# Patient Record
Sex: Female | Born: 1945 | Race: White | Hispanic: No | Marital: Married | Smoking: Former smoker
Health system: Southern US, Community
[De-identification: ages and names within clinical notes are randomized; demographics above are authoritative.]

## PROBLEM LIST (undated history)

## (undated) DIAGNOSIS — Z860100 Personal history of colon polyps, unspecified: Secondary | ICD-10-CM

## (undated) DIAGNOSIS — T7840XA Allergy, unspecified, initial encounter: Secondary | ICD-10-CM

## (undated) DIAGNOSIS — M1990 Unspecified osteoarthritis, unspecified site: Secondary | ICD-10-CM

## (undated) DIAGNOSIS — I493 Ventricular premature depolarization: Secondary | ICD-10-CM

## (undated) DIAGNOSIS — I10 Essential (primary) hypertension: Secondary | ICD-10-CM

## (undated) DIAGNOSIS — H35059 Retinal neovascularization, unspecified, unspecified eye: Secondary | ICD-10-CM

## (undated) DIAGNOSIS — C4491 Basal cell carcinoma of skin, unspecified: Secondary | ICD-10-CM

## (undated) DIAGNOSIS — E785 Hyperlipidemia, unspecified: Secondary | ICD-10-CM

## (undated) DIAGNOSIS — H269 Unspecified cataract: Secondary | ICD-10-CM

## (undated) DIAGNOSIS — K589 Irritable bowel syndrome without diarrhea: Secondary | ICD-10-CM

## (undated) DIAGNOSIS — G43909 Migraine, unspecified, not intractable, without status migrainosus: Secondary | ICD-10-CM

## (undated) DIAGNOSIS — I491 Atrial premature depolarization: Secondary | ICD-10-CM

## (undated) DIAGNOSIS — I351 Nonrheumatic aortic (valve) insufficiency: Secondary | ICD-10-CM

## (undated) DIAGNOSIS — Z8619 Personal history of other infectious and parasitic diseases: Secondary | ICD-10-CM

## (undated) DIAGNOSIS — M797 Fibromyalgia: Secondary | ICD-10-CM

## (undated) DIAGNOSIS — H35051 Retinal neovascularization, unspecified, right eye: Secondary | ICD-10-CM

## (undated) DIAGNOSIS — R931 Abnormal findings on diagnostic imaging of heart and coronary circulation: Secondary | ICD-10-CM

## (undated) DIAGNOSIS — I341 Nonrheumatic mitral (valve) prolapse: Secondary | ICD-10-CM

## (undated) HISTORY — DX: Migraine, unspecified, not intractable, without status migrainosus: G43.909

## (undated) HISTORY — DX: Irritable bowel syndrome, unspecified: K58.9

## (undated) HISTORY — DX: Allergy, unspecified, initial encounter: T78.40XA

## (undated) HISTORY — DX: Retinal neovascularization, unspecified, unspecified eye: H35.059

## (undated) HISTORY — PX: COLONOSCOPY: SHX174

## (undated) HISTORY — DX: Ventricular premature depolarization: I49.3

## (undated) HISTORY — DX: Basal cell carcinoma of skin, unspecified: C44.91

## (undated) HISTORY — DX: Unspecified cataract: H26.9

## (undated) HISTORY — DX: Abnormal findings on diagnostic imaging of heart and coronary circulation: R93.1

## (undated) HISTORY — DX: Personal history of colon polyps, unspecified: Z86.0100

## (undated) HISTORY — DX: Nonrheumatic mitral (valve) prolapse: I34.1

## (undated) HISTORY — DX: Personal history of colonic polyps: Z86.010

## (undated) HISTORY — DX: Unspecified osteoarthritis, unspecified site: M19.90

## (undated) HISTORY — DX: Fibromyalgia: M79.7

## (undated) HISTORY — DX: Nonrheumatic aortic (valve) insufficiency: I35.1

## (undated) HISTORY — DX: Essential (primary) hypertension: I10

## (undated) HISTORY — DX: Hyperlipidemia, unspecified: E78.5

## (undated) HISTORY — DX: Retinal neovascularization, unspecified, right eye: H35.051

## (undated) HISTORY — PX: WISDOM TOOTH EXTRACTION: SHX21

## (undated) HISTORY — DX: Atrial premature depolarization: I49.1

## (undated) HISTORY — DX: Personal history of other infectious and parasitic diseases: Z86.19

---

## 1976-04-28 HISTORY — PX: TUBAL LIGATION: SHX77

## 1998-03-26 ENCOUNTER — Other Ambulatory Visit: Admission: RE | Admit: 1998-03-26 | Discharge: 1998-03-26 | Payer: Self-pay | Admitting: Obstetrics and Gynecology

## 1999-04-12 ENCOUNTER — Other Ambulatory Visit: Admission: RE | Admit: 1999-04-12 | Discharge: 1999-04-12 | Payer: Self-pay | Admitting: Obstetrics and Gynecology

## 1999-08-03 ENCOUNTER — Encounter: Payer: Self-pay | Admitting: Emergency Medicine

## 1999-08-03 ENCOUNTER — Observation Stay (HOSPITAL_COMMUNITY): Admission: EM | Admit: 1999-08-03 | Discharge: 1999-08-04 | Payer: Self-pay | Admitting: Emergency Medicine

## 2000-04-13 ENCOUNTER — Other Ambulatory Visit: Admission: RE | Admit: 2000-04-13 | Discharge: 2000-04-13 | Payer: Self-pay | Admitting: Obstetrics and Gynecology

## 2000-10-16 ENCOUNTER — Encounter (INDEPENDENT_AMBULATORY_CARE_PROVIDER_SITE_OTHER): Payer: Self-pay

## 2000-10-16 ENCOUNTER — Other Ambulatory Visit: Admission: RE | Admit: 2000-10-16 | Discharge: 2000-10-16 | Payer: Self-pay | Admitting: Obstetrics and Gynecology

## 2000-12-17 ENCOUNTER — Encounter: Payer: Self-pay | Admitting: Internal Medicine

## 2000-12-17 ENCOUNTER — Encounter (INDEPENDENT_AMBULATORY_CARE_PROVIDER_SITE_OTHER): Payer: Self-pay | Admitting: Specialist

## 2000-12-17 ENCOUNTER — Other Ambulatory Visit: Admission: RE | Admit: 2000-12-17 | Discharge: 2000-12-17 | Payer: Self-pay | Admitting: Internal Medicine

## 2001-01-13 ENCOUNTER — Inpatient Hospital Stay (HOSPITAL_COMMUNITY): Admission: EM | Admit: 2001-01-13 | Discharge: 2001-01-14 | Payer: Self-pay | Admitting: *Deleted

## 2001-04-14 ENCOUNTER — Other Ambulatory Visit: Admission: RE | Admit: 2001-04-14 | Discharge: 2001-04-14 | Payer: Self-pay | Admitting: Obstetrics and Gynecology

## 2002-12-08 ENCOUNTER — Other Ambulatory Visit: Admission: RE | Admit: 2002-12-08 | Discharge: 2002-12-08 | Payer: Self-pay | Admitting: Obstetrics and Gynecology

## 2003-12-26 ENCOUNTER — Other Ambulatory Visit: Admission: RE | Admit: 2003-12-26 | Discharge: 2003-12-26 | Payer: Self-pay | Admitting: Obstetrics and Gynecology

## 2005-01-07 ENCOUNTER — Other Ambulatory Visit: Admission: RE | Admit: 2005-01-07 | Discharge: 2005-01-07 | Payer: Self-pay | Admitting: Obstetrics and Gynecology

## 2005-11-14 ENCOUNTER — Ambulatory Visit: Payer: Self-pay | Admitting: Internal Medicine

## 2005-12-02 ENCOUNTER — Ambulatory Visit: Payer: Self-pay | Admitting: Internal Medicine

## 2005-12-02 ENCOUNTER — Encounter (INDEPENDENT_AMBULATORY_CARE_PROVIDER_SITE_OTHER): Payer: Self-pay | Admitting: Specialist

## 2006-01-13 ENCOUNTER — Other Ambulatory Visit: Admission: RE | Admit: 2006-01-13 | Discharge: 2006-01-13 | Payer: Self-pay | Admitting: Obstetrics and Gynecology

## 2007-01-15 ENCOUNTER — Other Ambulatory Visit: Admission: RE | Admit: 2007-01-15 | Discharge: 2007-01-15 | Payer: Self-pay | Admitting: Obstetrics and Gynecology

## 2008-04-28 HISTORY — PX: BELPHAROPTOSIS REPAIR: SHX369

## 2008-07-03 ENCOUNTER — Encounter: Payer: Self-pay | Admitting: Internal Medicine

## 2008-08-10 ENCOUNTER — Encounter: Payer: Self-pay | Admitting: Internal Medicine

## 2008-08-17 ENCOUNTER — Telehealth: Payer: Self-pay | Admitting: Internal Medicine

## 2008-08-18 DIAGNOSIS — K5730 Diverticulosis of large intestine without perforation or abscess without bleeding: Secondary | ICD-10-CM | POA: Insufficient documentation

## 2008-08-18 DIAGNOSIS — Z860100 Personal history of colon polyps, unspecified: Secondary | ICD-10-CM | POA: Insufficient documentation

## 2008-08-18 DIAGNOSIS — K219 Gastro-esophageal reflux disease without esophagitis: Secondary | ICD-10-CM | POA: Insufficient documentation

## 2008-08-18 DIAGNOSIS — R1013 Epigastric pain: Secondary | ICD-10-CM

## 2008-08-28 ENCOUNTER — Ambulatory Visit: Payer: Self-pay | Admitting: Internal Medicine

## 2008-08-28 DIAGNOSIS — E785 Hyperlipidemia, unspecified: Secondary | ICD-10-CM | POA: Insufficient documentation

## 2008-08-28 DIAGNOSIS — IMO0001 Reserved for inherently not codable concepts without codable children: Secondary | ICD-10-CM | POA: Insufficient documentation

## 2008-08-28 DIAGNOSIS — I059 Rheumatic mitral valve disease, unspecified: Secondary | ICD-10-CM | POA: Insufficient documentation

## 2008-08-28 DIAGNOSIS — I4949 Other premature depolarization: Secondary | ICD-10-CM

## 2008-08-28 DIAGNOSIS — I491 Atrial premature depolarization: Secondary | ICD-10-CM

## 2008-08-28 DIAGNOSIS — M129 Arthropathy, unspecified: Secondary | ICD-10-CM | POA: Insufficient documentation

## 2008-08-28 DIAGNOSIS — I10 Essential (primary) hypertension: Secondary | ICD-10-CM

## 2008-10-10 ENCOUNTER — Telehealth: Payer: Self-pay | Admitting: Internal Medicine

## 2008-10-16 ENCOUNTER — Encounter: Payer: Self-pay | Admitting: Internal Medicine

## 2008-10-16 ENCOUNTER — Ambulatory Visit (HOSPITAL_COMMUNITY): Admission: RE | Admit: 2008-10-16 | Discharge: 2008-10-16 | Payer: Self-pay | Admitting: Internal Medicine

## 2008-10-17 ENCOUNTER — Ambulatory Visit: Payer: Self-pay | Admitting: Internal Medicine

## 2008-10-17 ENCOUNTER — Encounter: Payer: Self-pay | Admitting: Internal Medicine

## 2008-10-18 ENCOUNTER — Encounter: Payer: Self-pay | Admitting: Internal Medicine

## 2008-11-02 ENCOUNTER — Encounter: Payer: Self-pay | Admitting: Internal Medicine

## 2008-11-03 ENCOUNTER — Ambulatory Visit: Payer: Self-pay | Admitting: Internal Medicine

## 2009-04-28 HISTORY — PX: BREAST BIOPSY: SHX20

## 2010-09-13 NOTE — Consult Note (Signed)
NAME:  Michelle Davidson, WILEY NO.:  [id number]   MEDICAL RECORD NO.:  [id number]           PATIENT TYPE:   LOCATION:                                 FACILITY:   PHYSICIAN:  Hedwig Morton. Juanda Chance, MD     DATE OF BIRTH:  [birth year]   DATE OF CONSULTATION:  DATE OF DISCHARGE:                                 CONSULTATION   NAME OF PROCEDURE:  Ambulatory pH procedure.   INDICATIONS:  This 65 year old white female  who has complained  of  scratchiness in her throat, tightness. She was diagnosed with Barrett  esophagus based on upper endoscopy done in her hometown by her  gastroenterologist.  The histological diagnosis , however, did not  confirm  Barrett esophagus.  She has not responded to proton pump  inhibitors.  In fact, she had side effects from them.  She is undergoing  24 pH probe  off medications to determine the severity her  acid reflux.   RESULTS:  Two-channel probe has passed through nares into the distal  esophagus.  The distal catheter was placed 5 cm above the lower  esophageal sphincter and  the proximal probe  at 10 cm above the lower  esophageal sphincter.  In proximal channel, the pH in the esophagus was  less than 4.2% of the time, total 0.1% of the time, all in upright  position.  There were no episodes of reflux lasting over 5 minutes.  There were a total of only 9 reflux episodes.  In the distal channel,  the acidity in the esophagus was less than 4.6% of the time which is  abnormally high, normal being less than 4.2%.  Most of the pH < 4  occurred in the recumbent position  at 4.5% of the time.  The patient  had a total of 118 reflux episodes, 63 of them in upright position and  55 in the recumbent position.  Longest episode of the reflux lasted 19  minutes in upright position and 17 minutes in recumbent position.   The composite score analysis showed  score of 32.5 which is abnormally  high , normal being less than 22.   IMPRESSION:  This is an abnormal  intraesophageal pH probe study  comnsistent  a significant gastroesophageal reflux off medications.      Hedwig Morton. Juanda Chance, MD  Electronically Signed     DMB/MEDQ  D:  11/02/2008  T:  11/03/2008  Job:  [id number]

## 2010-12-25 ENCOUNTER — Encounter: Payer: Self-pay | Admitting: Internal Medicine

## 2012-01-15 ENCOUNTER — Encounter: Payer: Self-pay | Admitting: Internal Medicine

## 2014-02-03 ENCOUNTER — Encounter: Payer: Self-pay | Admitting: Internal Medicine

## 2016-01-21 LAB — CBC AND DIFFERENTIAL
HEMATOCRIT: 39 (ref 36–46)
HEMOGLOBIN: 13.4 (ref 12.0–16.0)
PLATELETS: 271 (ref 150–399)
WBC: 5.6

## 2016-05-06 DIAGNOSIS — I059 Rheumatic mitral valve disease, unspecified: Secondary | ICD-10-CM | POA: Diagnosis not present

## 2016-05-06 DIAGNOSIS — R9431 Abnormal electrocardiogram [ECG] [EKG]: Secondary | ICD-10-CM | POA: Diagnosis not present

## 2016-05-06 DIAGNOSIS — I491 Atrial premature depolarization: Secondary | ICD-10-CM | POA: Diagnosis not present

## 2016-05-06 DIAGNOSIS — I10 Essential (primary) hypertension: Secondary | ICD-10-CM | POA: Diagnosis not present

## 2016-05-06 DIAGNOSIS — R002 Palpitations: Secondary | ICD-10-CM | POA: Diagnosis not present

## 2016-05-06 DIAGNOSIS — I493 Ventricular premature depolarization: Secondary | ICD-10-CM | POA: Diagnosis not present

## 2016-05-29 DIAGNOSIS — R208 Other disturbances of skin sensation: Secondary | ICD-10-CM | POA: Diagnosis not present

## 2016-05-29 DIAGNOSIS — Z09 Encounter for follow-up examination after completed treatment for conditions other than malignant neoplasm: Secondary | ICD-10-CM | POA: Diagnosis not present

## 2016-05-29 DIAGNOSIS — L820 Inflamed seborrheic keratosis: Secondary | ICD-10-CM | POA: Diagnosis not present

## 2016-05-29 DIAGNOSIS — Z872 Personal history of diseases of the skin and subcutaneous tissue: Secondary | ICD-10-CM | POA: Diagnosis not present

## 2016-05-29 DIAGNOSIS — L538 Other specified erythematous conditions: Secondary | ICD-10-CM | POA: Diagnosis not present

## 2016-06-10 DIAGNOSIS — H353211 Exudative age-related macular degeneration, right eye, with active choroidal neovascularization: Secondary | ICD-10-CM | POA: Diagnosis not present

## 2016-06-12 DIAGNOSIS — E782 Mixed hyperlipidemia: Secondary | ICD-10-CM | POA: Diagnosis not present

## 2016-06-12 DIAGNOSIS — M542 Cervicalgia: Secondary | ICD-10-CM | POA: Diagnosis not present

## 2016-06-12 DIAGNOSIS — I10 Essential (primary) hypertension: Secondary | ICD-10-CM | POA: Diagnosis not present

## 2016-06-12 DIAGNOSIS — Z6828 Body mass index (BMI) 28.0-28.9, adult: Secondary | ICD-10-CM | POA: Diagnosis not present

## 2016-06-12 DIAGNOSIS — Z8669 Personal history of other diseases of the nervous system and sense organs: Secondary | ICD-10-CM | POA: Diagnosis not present

## 2016-06-13 DIAGNOSIS — H353211 Exudative age-related macular degeneration, right eye, with active choroidal neovascularization: Secondary | ICD-10-CM | POA: Diagnosis not present

## 2016-07-01 DIAGNOSIS — K5730 Diverticulosis of large intestine without perforation or abscess without bleeding: Secondary | ICD-10-CM | POA: Diagnosis not present

## 2016-07-01 DIAGNOSIS — D759 Disease of blood and blood-forming organs, unspecified: Secondary | ICD-10-CM | POA: Diagnosis not present

## 2016-07-01 DIAGNOSIS — Z800 Family history of malignant neoplasm of digestive organs: Secondary | ICD-10-CM | POA: Diagnosis not present

## 2016-07-01 DIAGNOSIS — Z86010 Personal history of colonic polyps: Secondary | ICD-10-CM | POA: Diagnosis not present

## 2016-07-01 DIAGNOSIS — K635 Polyp of colon: Secondary | ICD-10-CM | POA: Diagnosis not present

## 2016-07-01 DIAGNOSIS — Z7982 Long term (current) use of aspirin: Secondary | ICD-10-CM | POA: Diagnosis not present

## 2016-07-01 DIAGNOSIS — A150 Tuberculosis of lung: Secondary | ICD-10-CM | POA: Diagnosis not present

## 2016-07-01 DIAGNOSIS — K529 Noninfective gastroenteritis and colitis, unspecified: Secondary | ICD-10-CM | POA: Diagnosis not present

## 2016-07-04 DIAGNOSIS — D759 Disease of blood and blood-forming organs, unspecified: Secondary | ICD-10-CM | POA: Diagnosis not present

## 2016-07-04 DIAGNOSIS — Z1211 Encounter for screening for malignant neoplasm of colon: Secondary | ICD-10-CM | POA: Diagnosis not present

## 2016-07-04 DIAGNOSIS — Z86010 Personal history of colonic polyps: Secondary | ICD-10-CM | POA: Diagnosis not present

## 2016-07-04 DIAGNOSIS — D122 Benign neoplasm of ascending colon: Secondary | ICD-10-CM | POA: Diagnosis not present

## 2016-07-04 DIAGNOSIS — K648 Other hemorrhoids: Secondary | ICD-10-CM | POA: Diagnosis not present

## 2016-07-04 DIAGNOSIS — K5730 Diverticulosis of large intestine without perforation or abscess without bleeding: Secondary | ICD-10-CM | POA: Diagnosis not present

## 2016-07-04 DIAGNOSIS — K529 Noninfective gastroenteritis and colitis, unspecified: Secondary | ICD-10-CM | POA: Diagnosis not present

## 2016-07-04 DIAGNOSIS — K635 Polyp of colon: Secondary | ICD-10-CM | POA: Diagnosis not present

## 2016-07-04 DIAGNOSIS — Z800 Family history of malignant neoplasm of digestive organs: Secondary | ICD-10-CM | POA: Diagnosis not present

## 2016-07-04 DIAGNOSIS — D125 Benign neoplasm of sigmoid colon: Secondary | ICD-10-CM | POA: Diagnosis not present

## 2016-07-04 DIAGNOSIS — Z7982 Long term (current) use of aspirin: Secondary | ICD-10-CM | POA: Diagnosis not present

## 2016-07-04 DIAGNOSIS — A150 Tuberculosis of lung: Secondary | ICD-10-CM | POA: Diagnosis not present

## 2016-07-04 DIAGNOSIS — D124 Benign neoplasm of descending colon: Secondary | ICD-10-CM | POA: Diagnosis not present

## 2016-07-18 DIAGNOSIS — D126 Benign neoplasm of colon, unspecified: Secondary | ICD-10-CM | POA: Diagnosis not present

## 2016-07-18 DIAGNOSIS — R197 Diarrhea, unspecified: Secondary | ICD-10-CM | POA: Diagnosis not present

## 2016-08-15 DIAGNOSIS — H5203 Hypermetropia, bilateral: Secondary | ICD-10-CM | POA: Diagnosis not present

## 2016-08-15 DIAGNOSIS — H25813 Combined forms of age-related cataract, bilateral: Secondary | ICD-10-CM | POA: Diagnosis not present

## 2016-09-12 DIAGNOSIS — H353211 Exudative age-related macular degeneration, right eye, with active choroidal neovascularization: Secondary | ICD-10-CM | POA: Diagnosis not present

## 2016-09-30 DIAGNOSIS — K635 Polyp of colon: Secondary | ICD-10-CM | POA: Diagnosis not present

## 2016-09-30 DIAGNOSIS — M542 Cervicalgia: Secondary | ICD-10-CM | POA: Diagnosis not present

## 2016-09-30 DIAGNOSIS — Z6828 Body mass index (BMI) 28.0-28.9, adult: Secondary | ICD-10-CM | POA: Diagnosis not present

## 2016-09-30 DIAGNOSIS — I10 Essential (primary) hypertension: Secondary | ICD-10-CM | POA: Diagnosis not present

## 2016-09-30 DIAGNOSIS — E782 Mixed hyperlipidemia: Secondary | ICD-10-CM | POA: Diagnosis not present

## 2016-10-13 DIAGNOSIS — M542 Cervicalgia: Secondary | ICD-10-CM | POA: Diagnosis not present

## 2016-10-13 DIAGNOSIS — R29898 Other symptoms and signs involving the musculoskeletal system: Secondary | ICD-10-CM | POA: Diagnosis not present

## 2016-10-14 DIAGNOSIS — K529 Noninfective gastroenteritis and colitis, unspecified: Secondary | ICD-10-CM | POA: Diagnosis not present

## 2016-10-14 DIAGNOSIS — Z1231 Encounter for screening mammogram for malignant neoplasm of breast: Secondary | ICD-10-CM | POA: Diagnosis not present

## 2016-10-14 DIAGNOSIS — Z01419 Encounter for gynecological examination (general) (routine) without abnormal findings: Secondary | ICD-10-CM | POA: Diagnosis not present

## 2016-10-15 DIAGNOSIS — M542 Cervicalgia: Secondary | ICD-10-CM | POA: Diagnosis not present

## 2016-10-15 DIAGNOSIS — R29898 Other symptoms and signs involving the musculoskeletal system: Secondary | ICD-10-CM | POA: Diagnosis not present

## 2016-10-20 DIAGNOSIS — M542 Cervicalgia: Secondary | ICD-10-CM | POA: Diagnosis not present

## 2016-10-20 DIAGNOSIS — R29898 Other symptoms and signs involving the musculoskeletal system: Secondary | ICD-10-CM | POA: Diagnosis not present

## 2016-10-23 DIAGNOSIS — R29898 Other symptoms and signs involving the musculoskeletal system: Secondary | ICD-10-CM | POA: Diagnosis not present

## 2016-10-23 DIAGNOSIS — M542 Cervicalgia: Secondary | ICD-10-CM | POA: Diagnosis not present

## 2016-10-28 DIAGNOSIS — M542 Cervicalgia: Secondary | ICD-10-CM | POA: Diagnosis not present

## 2016-10-28 DIAGNOSIS — R29898 Other symptoms and signs involving the musculoskeletal system: Secondary | ICD-10-CM | POA: Diagnosis not present

## 2016-10-30 DIAGNOSIS — R29898 Other symptoms and signs involving the musculoskeletal system: Secondary | ICD-10-CM | POA: Diagnosis not present

## 2016-10-30 DIAGNOSIS — M542 Cervicalgia: Secondary | ICD-10-CM | POA: Diagnosis not present

## 2016-11-04 DIAGNOSIS — R29898 Other symptoms and signs involving the musculoskeletal system: Secondary | ICD-10-CM | POA: Diagnosis not present

## 2016-11-04 DIAGNOSIS — R197 Diarrhea, unspecified: Secondary | ICD-10-CM | POA: Diagnosis not present

## 2016-11-04 DIAGNOSIS — Z6828 Body mass index (BMI) 28.0-28.9, adult: Secondary | ICD-10-CM | POA: Diagnosis not present

## 2016-11-04 DIAGNOSIS — M542 Cervicalgia: Secondary | ICD-10-CM | POA: Diagnosis not present

## 2016-11-05 DIAGNOSIS — H353211 Exudative age-related macular degeneration, right eye, with active choroidal neovascularization: Secondary | ICD-10-CM | POA: Diagnosis not present

## 2016-11-05 DIAGNOSIS — H2513 Age-related nuclear cataract, bilateral: Secondary | ICD-10-CM | POA: Diagnosis not present

## 2016-11-06 DIAGNOSIS — M542 Cervicalgia: Secondary | ICD-10-CM | POA: Diagnosis not present

## 2016-11-06 DIAGNOSIS — R29898 Other symptoms and signs involving the musculoskeletal system: Secondary | ICD-10-CM | POA: Diagnosis not present

## 2016-11-13 DIAGNOSIS — Z9289 Personal history of other medical treatment: Secondary | ICD-10-CM | POA: Diagnosis not present

## 2016-11-13 DIAGNOSIS — Z1231 Encounter for screening mammogram for malignant neoplasm of breast: Secondary | ICD-10-CM | POA: Diagnosis not present

## 2016-11-13 DIAGNOSIS — Z803 Family history of malignant neoplasm of breast: Secondary | ICD-10-CM | POA: Diagnosis not present

## 2016-12-15 DIAGNOSIS — Z23 Encounter for immunization: Secondary | ICD-10-CM | POA: Diagnosis not present

## 2016-12-15 DIAGNOSIS — Z0000 Encounter for general adult medical examination without abnormal findings: Secondary | ICD-10-CM | POA: Diagnosis not present

## 2016-12-15 DIAGNOSIS — N959 Unspecified menopausal and perimenopausal disorder: Secondary | ICD-10-CM | POA: Diagnosis not present

## 2016-12-15 DIAGNOSIS — Z8669 Personal history of other diseases of the nervous system and sense organs: Secondary | ICD-10-CM | POA: Diagnosis not present

## 2016-12-15 DIAGNOSIS — Z6828 Body mass index (BMI) 28.0-28.9, adult: Secondary | ICD-10-CM | POA: Diagnosis not present

## 2016-12-15 DIAGNOSIS — Z1389 Encounter for screening for other disorder: Secondary | ICD-10-CM | POA: Diagnosis not present

## 2016-12-18 DIAGNOSIS — H353211 Exudative age-related macular degeneration, right eye, with active choroidal neovascularization: Secondary | ICD-10-CM | POA: Diagnosis not present

## 2017-02-09 DIAGNOSIS — Z23 Encounter for immunization: Secondary | ICD-10-CM | POA: Diagnosis not present

## 2017-03-24 DIAGNOSIS — H2513 Age-related nuclear cataract, bilateral: Secondary | ICD-10-CM | POA: Diagnosis not present

## 2017-03-24 DIAGNOSIS — H43822 Vitreomacular adhesion, left eye: Secondary | ICD-10-CM | POA: Diagnosis not present

## 2017-03-24 DIAGNOSIS — H43811 Vitreous degeneration, right eye: Secondary | ICD-10-CM | POA: Diagnosis not present

## 2017-05-19 ENCOUNTER — Ambulatory Visit (INDEPENDENT_AMBULATORY_CARE_PROVIDER_SITE_OTHER): Payer: Medicare Other | Admitting: Family Medicine

## 2017-05-19 ENCOUNTER — Encounter: Payer: Self-pay | Admitting: Family Medicine

## 2017-05-19 VITALS — BP 126/62 | HR 62 | Temp 98.4°F | Ht 62.75 in | Wt 158.0 lb

## 2017-05-19 DIAGNOSIS — I10 Essential (primary) hypertension: Secondary | ICD-10-CM | POA: Diagnosis not present

## 2017-05-19 DIAGNOSIS — R05 Cough: Secondary | ICD-10-CM | POA: Diagnosis not present

## 2017-05-19 DIAGNOSIS — Z860100 Personal history of colon polyps, unspecified: Secondary | ICD-10-CM

## 2017-05-19 DIAGNOSIS — F4321 Adjustment disorder with depressed mood: Secondary | ICD-10-CM | POA: Diagnosis not present

## 2017-05-19 DIAGNOSIS — R053 Chronic cough: Secondary | ICD-10-CM

## 2017-05-19 MED ORDER — HYDROCHLOROTHIAZIDE 12.5 MG PO CAPS: 12.5000 mg | ORAL_CAPSULE | Freq: Every day | ORAL | 1 refills | Status: DC

## 2017-05-19 MED ORDER — ATENOLOL 25 MG PO TABS: 25.0000 mg | ORAL_TABLET | Freq: Two times a day (BID) | ORAL | 1 refills | Status: DC

## 2017-05-19 MED ORDER — HYDROCODONE-HOMATROPINE 5-1.5 MG/5ML PO SYRP: 5.0000 mL | ORAL_SOLUTION | Freq: Three times a day (TID) | ORAL | 0 refills | Status: DC | PRN

## 2017-05-19 NOTE — Assessment & Plan Note (Signed)
Exam and history reassuring. Likely viral. Continue supportive care. Rx for hycodan printed and given to pt for severe cough- discussed sedation precautions.

## 2017-05-19 NOTE — Patient Instructions (Addendum)
Great to meet you.  We are referring you to gastroenterology.

## 2017-05-19 NOTE — Progress Notes (Signed)
Subjective:   Patient ID: Michelle Davidson, female    DOB: 1946-04-17, 72 y.o.   MRN: [id number]  Michelle Davidson is a pleasant 72 y.o. year old femaleyear old female who presents to clinic today with New Patient (Initial Visit) (Patient is here today to establish care); Grief Response (Patient disclosed while completing the PSQ that she lost her 73 1-week-ago at 72 years old from brain hemmorhage.); and Cough (Patient is here today C/O a cough that started on the 17th.  It is nonproductive.  Unable to sleep due to the cough and has been taking Robitussin DM but Sx still persist.  She thought she got rid of it from when it originally started around Thanksgiving but it has now come back and is worse.  Mild post-nasal drip.  Denies any facial pressure but is positive for nasal congestion. States "I've gone through boxes of nasal tissues.")  on 05/19/2017  HPI:  Grief- unfortunately lost her 9 year grandson 1 week ago to a brain hemorrhage. PHQ 9 is 14 today.   Cough- started 2 months ago.  Felt it had improved until 5 days ago when it became acutely worse.  Non productive.  Keeping her up at night.  Robitussin DM has helped but only a little.  HTN- has been well controlled with atenolol 25 mg twice daily and HCTZ 12.5 mg daily. Denies HA, blurred vision, CP or LE edema.  Current Outpatient Medications on File Prior to Visit  Medication Sig Dispense Refill  . fluticasone (FLONASE) 50 MCG/ACT nasal spray Place 1 spray into both nostrils daily.    . Multiple Vitamins-Minerals (CENTRUM SILVER 50+WOMEN PO) Take 1 tablet by mouth daily.    Marland Kitchen zolmitriptan (ZOMIG-ZMT) 5 MG disintegrating tablet zolmitriptan 5 mg disintegrating tablet  DISSOLVE 1 TABLET ON TONGUE AND SWALLOW AT ONSET OF MIGRAINE MAY REPEAT ONCE AFTER 2HRS MAX 10MG /DAY     No current facility-administered medications on file prior to visit.     Allergies  Allergen Reactions  . Esomeprazole Magnesium     Syncope  . Lipitor [Atorvastatin  Calcium]     Muscle aches  . Zocor [Simvastatin]     Muscle aches  . Avelox [Moxifloxacin Hcl In Nacl] Rash    Past Medical History:  Diagnosis Date  . Arthritis   . Cancer (HCC)    Skin  . Choroidal neovascularization    Right eye  . History of chicken pox   . History of colon polyps   . Hyperlipidemia   . Hypertension   . Migraines   . PAC (premature atrial contraction)   . PVC (premature ventricular contraction)       Family History  Problem Relation Age of Onset  . Arthritis Mother   . Hyperlipidemia Mother   . Hypertension Mother   . Cancer Father        Colon  . Arthritis Maternal Grandmother   . Diabetes Maternal Grandmother   . Cancer Maternal Grandfather        Lung-Smoker  . Heart attack Maternal Grandfather   . Cancer Paternal Grandmother        Uterine  . Diabetes Paternal Grandmother   . Cancer Paternal Grandfather        Skin    Social History   Socioeconomic History  . Marital status: Married    Spouse name: Not on file  . Number of children: Not on file  . Years of education: Not on file  . Highest education level: Not on file  Social Needs  . Financial resource strain: Not on file  . Food insecurity - worry: Not on file  . Food insecurity - inability: Not on file  . Transportation needs - medical: Not on file  . Transportation needs - non-medical: Not on file  Occupational History  . Not on file  Tobacco Use  . Smoking status: Former Smoker    Types: Cigarettes    Last attempt to quit: 1997    Years since quitting: 22.0  . Smokeless tobacco: Never Used  Substance and Sexual Activity  . Alcohol use: Yes    Comment: Occas  . Drug use: No  . Sexual activity: Yes    Partners: Male  Other Topics Concern  . Not on file  Social History Narrative  . Not on file   The PMH, PSH, Social History, Family History, Medications, and allergies have been reviewed in [facility], and have been updated if relevant.   Review of Systems    Constitutional: Negative.   HENT: Positive for congestion, postnasal drip and rhinorrhea. Negative for dental problem, drooling, ear discharge, ear pain, facial swelling, hearing loss, mouth sores, sinus pressure, sinus pain, sneezing, sore throat, tinnitus, trouble swallowing and voice change.   Eyes: Negative.   Respiratory: Positive for cough. Negative for apnea, choking, chest tightness, shortness of breath and wheezing.   Cardiovascular: Negative.   Gastrointestinal: Negative.   Endocrine: Negative.   Genitourinary: Negative.   Musculoskeletal: Negative.   Allergic/Immunologic: Negative.   Neurological: Negative.   Hematological: Negative.   Psychiatric/Behavioral: Positive for decreased concentration, dysphoric mood and sleep disturbance. Negative for agitation, behavioral problems, confusion, hallucinations, self-injury and suicidal ideas. The patient is nervous/anxious. The patient is not hyperactive.   All other systems reviewed and are negative.      Objective:    BP 126/62 (BP Location: Left Arm, Patient Position: Sitting, Cuff Size: Normal)   Pulse 62   Temp 98.4 F (36.9 C) (Oral)   Ht 5' 2.75" (1.594 m)   Wt 158 lb (71.7 kg)   SpO2 97%   BMI 28.21 kg/m    Physical Exam  Constitutional: She is oriented to person, place, and time. She appears well-developed and well-nourished. No distress.  HENT:  Head: Normocephalic and atraumatic.  Eyes: Conjunctivae are normal. Pupils are equal, round, and reactive to light.  Neck: Neck supple.  Cardiovascular: Normal rate and regular rhythm.  Pulmonary/Chest: Effort normal and breath sounds normal.  Abdominal: Soft.  Musculoskeletal: Normal range of motion. She exhibits no edema.  Neurological: She is alert and oriented to person, place, and time. No cranial nerve deficit.  Skin: Skin is warm and dry. She is not diaphoretic.  Psychiatric: She has a normal mood and affect. Her behavior is normal. Judgment and thought content  normal.  Nursing note and vitals reviewed.         Assessment & Plan:   Essential hypertension  Persistent cough  Grief No Follow-up on file.

## 2017-05-19 NOTE — Assessment & Plan Note (Signed)
Feels she is coping okay. Has counseling resources if she needs them. Call or return to clinic prn if these symptoms worsen or fail to improve as anticipated. The patient indicates understanding of these issues and agrees with the plan.

## 2017-05-19 NOTE — Assessment & Plan Note (Signed)
Well controlled.  No changes made. 

## 2017-05-20 ENCOUNTER — Telehealth: Payer: Self-pay | Admitting: Gastroenterology

## 2017-05-20 ENCOUNTER — Encounter: Payer: Self-pay | Admitting: Family Medicine

## 2017-05-20 NOTE — Telephone Encounter (Signed)
Received Colon/Path reports. Former Dr. Olevia Perches patient. Patient states that she is due for another colonoscopy.  Patient has moved back to the [place] area and is requesting to see Dr. Silverio Decamp. Records placed on her desk for review.

## 2017-05-20 NOTE — Telephone Encounter (Signed)
Patient requested to have Office Visit first. Appointment scheduled for 07/06/17 @ 9:15am

## 2017-05-20 NOTE — Telephone Encounter (Signed)
Ok to schedule direct colonoscopy, due for surveillance after piecemeal removal of large 1 cm polyp in R colon with biopsy forceps

## 2017-05-21 ENCOUNTER — Encounter: Payer: Self-pay | Admitting: Family Medicine

## 2017-05-21 DIAGNOSIS — G43909 Migraine, unspecified, not intractable, without status migrainosus: Secondary | ICD-10-CM | POA: Insufficient documentation

## 2017-05-26 DIAGNOSIS — H2513 Age-related nuclear cataract, bilateral: Secondary | ICD-10-CM | POA: Diagnosis not present

## 2017-05-26 DIAGNOSIS — H43822 Vitreomacular adhesion, left eye: Secondary | ICD-10-CM | POA: Diagnosis not present

## 2017-05-26 DIAGNOSIS — H43811 Vitreous degeneration, right eye: Secondary | ICD-10-CM | POA: Diagnosis not present

## 2017-05-26 DIAGNOSIS — H4423 Degenerative myopia, bilateral: Secondary | ICD-10-CM | POA: Diagnosis not present

## 2017-06-05 ENCOUNTER — Other Ambulatory Visit: Payer: Self-pay

## 2017-06-05 ENCOUNTER — Telehealth: Payer: Self-pay | Admitting: Family Medicine

## 2017-06-05 MED ORDER — FLUTICASONE PROPIONATE 50 MCG/ACT NA SUSP: 1.0000 | Freq: Every day | NASAL | 1 refills | Status: DC

## 2017-06-05 MED ORDER — ATENOLOL 25 MG PO TABS: 25.0000 mg | ORAL_TABLET | Freq: Two times a day (BID) | ORAL | 1 refills | Status: DC

## 2017-06-05 NOTE — Telephone Encounter (Signed)
Completed/thx dmf 

## 2017-06-05 NOTE — Telephone Encounter (Signed)
Copied from [place]. Topic: General - Other >> Jun 05, 2017  1:27 PM Lolita Rieger, [place] wrote: Reason for CRM: atenolol 25 mg and flonase are needed in a 90 days supply Pt pharmacy [facility] in [place] is her pharmacy

## 2017-06-15 DIAGNOSIS — H2513 Age-related nuclear cataract, bilateral: Secondary | ICD-10-CM | POA: Diagnosis not present

## 2017-06-15 DIAGNOSIS — H524 Presbyopia: Secondary | ICD-10-CM | POA: Diagnosis not present

## 2017-06-15 DIAGNOSIS — H35373 Puckering of macula, bilateral: Secondary | ICD-10-CM | POA: Diagnosis not present

## 2017-06-15 DIAGNOSIS — H25013 Cortical age-related cataract, bilateral: Secondary | ICD-10-CM | POA: Diagnosis not present

## 2017-06-16 DIAGNOSIS — Z85828 Personal history of other malignant neoplasm of skin: Secondary | ICD-10-CM | POA: Diagnosis not present

## 2017-06-16 DIAGNOSIS — L821 Other seborrheic keratosis: Secondary | ICD-10-CM | POA: Diagnosis not present

## 2017-07-06 ENCOUNTER — Ambulatory Visit (INDEPENDENT_AMBULATORY_CARE_PROVIDER_SITE_OTHER): Payer: Medicare Other | Admitting: Gastroenterology

## 2017-07-06 ENCOUNTER — Encounter: Payer: Self-pay | Admitting: Gastroenterology

## 2017-07-06 VITALS — BP 128/70 | HR 68 | Ht 62.5 in | Wt 158.5 lb

## 2017-07-06 DIAGNOSIS — K580 Irritable bowel syndrome with diarrhea: Secondary | ICD-10-CM | POA: Diagnosis not present

## 2017-07-06 DIAGNOSIS — Z800 Family history of malignant neoplasm of digestive organs: Secondary | ICD-10-CM

## 2017-07-06 DIAGNOSIS — Z86010 Personal history of colonic polyps: Secondary | ICD-10-CM

## 2017-07-06 MED ORDER — NA SULFATE-K SULFATE-MG SULF 17.5-3.13-1.6 GM/177ML PO SOLN: 1.0000 | Freq: Once | ORAL | 0 refills | Status: AC

## 2017-07-06 NOTE — Progress Notes (Signed)
Michelle Davidson    [id number]    1945-09-04  Primary Care Physician:Aron, Marciano Sequin, MD  Referring Physician: Lucille Passy, MD [place], [place] [id number]  Chief complaint:  IBS-Diarrhea HPI: 72 year old female here for new patient visit.  She has family history of colon cancer in father  Last colonoscopy march 2018 Small diverticula,  Flat 6 polyps in Sigmoid, ascending and descendin polyps removed piecemeal with cold forceps (tubular adenoma and 1 sessile serrated adenoma)  Colonoscopy: October 2017 Multiple flat polyps in Cecum (3) removed piecemeal with forceps and large flat polyps in ascending colon removed piecemeal with forceps: tubular adenoma  Patient also complaining of irritable bowel syndrome with predominant diarrhea, usually is worse in the morning, she has 2-3 bowel movements one after the other , usually starts out with solid stool sometimes thin but gets more lose as she goes multiple times .  She is also concerned about the color variation of the stool, it can vary  from very light to dark color. No blood or black tarry stool.  Denies any dysphagia, nausea, vomiting, abdominal pain or weight loss.  She has intermittent abdominal cramps worse with increased bowel frequency.  She drinks 3-4 large mugs of coffee in the morning.  Tries to avoid dairy products.   Outpatient Encounter Medications as of 07/06/2017  Medication Sig  . atenolol (TENORMIN) 25 MG tablet Take 1 tablet (25 mg total) by mouth 2 (two) times daily.  . fluticasone (FLONASE) 50 MCG/ACT nasal spray Place 1 spray into both nostrils daily.  . hydrochlorothiazide (MICROZIDE) 12.5 MG capsule Take 1 capsule (12.5 mg total) by mouth daily.  . Multiple Vitamins-Minerals (CENTRUM SILVER 50+WOMEN PO) Take 1 tablet by mouth daily.  Marland Kitchen zolmitriptan (ZOMIG-ZMT) 5 MG disintegrating tablet zolmitriptan 5 mg disintegrating tablet  DISSOLVE 1 TABLET ON TONGUE AND SWALLOW AT ONSET OF  MIGRAINE MAY REPEAT ONCE AFTER 2HRS MAX 10MG /DAY  . [DISCONTINUED] HYDROcodone-homatropine (HYCODAN) 5-1.5 MG/5ML syrup Take 5 mLs by mouth every 8 (eight) hours as needed for cough.   No facility-administered encounter medications on file as of 07/06/2017.     Allergies as of 07/06/2017 - Review Complete 07/06/2017  Allergen Reaction Noted  . Esomeprazole magnesium    . Lipitor [atorvastatin calcium]  05/19/2017  . Zocor [simvastatin]  05/19/2017  . Nexium [esomeprazole magnesium]  07/06/2017  . Avelox [moxifloxacin hcl in nacl] Rash 05/19/2017    Past Medical History:  Diagnosis Date  . Arthritis   . Basal cell carcinoma   . Choroidal neovascularization    Right eye  . CNVM (choroidal neovascular membrane), right   . Fibromyalgia   . History of chicken pox   . History of colon polyps   . Hyperlipidemia   . Hypertension   . Migraines   . PAC (premature atrial contraction)   . PVC (premature ventricular contraction)     Past Surgical History:  Procedure Laterality Date  . [place] Right 2010  . BREAST BIOPSY Left 2011   Titanium chip implanted to mark the spot/benign findings  . TUBAL LIGATION  1978  . WISDOM TOOTH EXTRACTION      Family History  Problem Relation Age of Onset  . Arthritis Mother   . Hyperlipidemia Mother   . Hypertension Mother   . Colon cancer Father   . Arthritis Maternal Grandmother   . Heart attack Maternal Grandfather   . Lung cancer Maternal Grandfather  Smoker  . Diabetes Paternal Grandmother   . Uterine cancer Paternal Grandmother   . Skin cancer Paternal Grandfather   . Diabetes Paternal Uncle   . Kidney cancer Paternal Uncle     Social History   Socioeconomic History  . Marital status: Married    Spouse name: Not on file  . Number of children: 2  . Years of education: Not on file  . Highest education level: Not on file  Social Needs  . Financial resource strain: Not on file  . Food insecurity - worry: Not  on file  . Food insecurity - inability: Not on file  . Transportation needs - medical: Not on file  . Transportation needs - non-medical: Not on file  Occupational History  . Occupation: retired  Tobacco Use  . Smoking status: Former Smoker    Types: Cigarettes    Last attempt to quit: 1997    Years since quitting: 22.2  . Smokeless tobacco: Never Used  Substance and Sexual Activity  . Alcohol use: Yes    Comment: cocktail every night  . Drug use: No  . Sexual activity: Yes    Partners: Male  Other Topics Concern  . Not on file  Social History Narrative  . Not on file      Review of systems: Review of Systems  Constitutional: Negative for fever and chills.  HENT: Negative.   Eyes: Negative for blurred vision.  Respiratory: Negative for cough, shortness of breath and wheezing.   Cardiovascular: Negative for chest pain and palpitations.  Gastrointestinal: as per HPI Genitourinary: Negative for dysuria, urgency, frequency and hematuria.  Musculoskeletal: Positive for myalgias, back pain and joint pain.  Skin: Negative for itching and rash.  Neurological: Negative for dizziness, tremors, focal weakness, seizures and loss of consciousness.  Endo/Heme/Allergies: Positive for seasonal allergies.  Psychiatric/Behavioral: Negative for depression, suicidal ideas and hallucinations.  All other systems reviewed and are negative.   Physical Exam: Vitals:   07/06/17 0903  BP: 128/70  Pulse: 68   Body mass index is 28.53 kg/m. Gen:      No acute distress HEENT:  EOMI, sclera anicteric Neck:     No masses; no thyromegaly Lungs:    Clear to auscultation bilaterally; normal respiratory effort CV:         Regular rate and rhythm; no murmurs Abd:      + bowel sounds; soft, non-tender; no palpable masses, no distension Ext:    No edema; adequate peripheral perfusion Skin:      Warm and dry; no rash Neuro: alert and oriented x 3 Psych: normal mood and affect  Data  Reviewed:  Reviewed labs, radiology imaging, old records and pertinent past GI work up   Assessment and Plan/Recommendations:  72 year old female with family history of colon cancer and personal history of multiple adenomatous colon polyps, removed piecemeal on her last 2 colonoscopies in 2017 and 2018 We will schedule for surveillance colonoscopy  Irritable bowel syndrome predominant diarrhea: Advised patient to decrease intake of coffee to 1-2 cups a day Trial of IB Gard 1 capsule up to 3 times a day as needed Lactose-free diet trial for 2 weeks  The risks and benefits as well as alternatives of endoscopic procedure(s) have been discussed and reviewed. All questions answered. The patient agrees to proceed.  Damaris Hippo , MD [phone] Mon-Fri 8a-5p [phone] after 5p, weekends, holidays  CC: Lucille Passy, MD  \

## 2017-07-06 NOTE — Patient Instructions (Addendum)
You have been scheduled for a colonoscopy. Please follow written instructions given to you at your visit today.  Please pick up your prep supplies at the pharmacy within the next 1-3 days. If you use inhalers (even only as needed), please bring them with you on the day of your procedure.  Take IBGard Over the counter 1 capsule three times a day as needed  Decrease the amount of coffee you consume in the morning   You may have a light breakfast the morning of prep day (the day before the procedure).   You may choose from one of the following items: eggs and toast OR chicken noodle soup and crackers.   You should have your breakfast completed between 8:00 and 9:00 am the day before your procedure.    After you have had your light breakfast you should start a clear liquid diet only, NO SOLIDS. No additional solid food is allowed. You may continue to have clear liquid up to 3 hours prior to your procedure.

## 2017-07-09 ENCOUNTER — Telehealth: Payer: Self-pay | Admitting: Gastroenterology

## 2017-07-09 NOTE — Telephone Encounter (Signed)
I have a suprep sample

## 2017-07-09 NOTE — Telephone Encounter (Signed)
Patent will be here Monday for her sample

## 2017-08-05 ENCOUNTER — Encounter: Payer: Self-pay | Admitting: Gastroenterology

## 2017-08-05 ENCOUNTER — Other Ambulatory Visit: Payer: Self-pay

## 2017-08-05 ENCOUNTER — Ambulatory Visit (AMBULATORY_SURGERY_CENTER): Payer: Medicare Other | Admitting: Gastroenterology

## 2017-08-05 VITALS — BP 121/43 | HR 67 | Temp 98.9°F | Resp 15 | Ht 62.5 in | Wt 158.0 lb

## 2017-08-05 DIAGNOSIS — Z86010 Personal history of colonic polyps: Secondary | ICD-10-CM

## 2017-08-05 DIAGNOSIS — E669 Obesity, unspecified: Secondary | ICD-10-CM | POA: Diagnosis not present

## 2017-08-05 DIAGNOSIS — M797 Fibromyalgia: Secondary | ICD-10-CM | POA: Diagnosis not present

## 2017-08-05 DIAGNOSIS — D120 Benign neoplasm of cecum: Secondary | ICD-10-CM | POA: Diagnosis not present

## 2017-08-05 DIAGNOSIS — I10 Essential (primary) hypertension: Secondary | ICD-10-CM | POA: Diagnosis not present

## 2017-08-05 DIAGNOSIS — Z800 Family history of malignant neoplasm of digestive organs: Secondary | ICD-10-CM

## 2017-08-05 DIAGNOSIS — D124 Benign neoplasm of descending colon: Secondary | ICD-10-CM

## 2017-08-05 DIAGNOSIS — D123 Benign neoplasm of transverse colon: Secondary | ICD-10-CM | POA: Diagnosis not present

## 2017-08-05 MED ORDER — SODIUM CHLORIDE 0.9 % IV SOLN: 500.0000 mL | Freq: Once | INTRAVENOUS | Status: DC

## 2017-08-05 NOTE — Progress Notes (Signed)
No problems noted in the recovery room. maw 

## 2017-08-05 NOTE — Patient Instructions (Signed)
YOU HAD AN ENDOSCOPIC PROCEDURE TODAY AT THE [facility]:   Refer to the procedure report that was given to you for any specific questions about what was found during the examination.  If the procedure report does not answer your questions, please call your gastroenterologist to clarify.  If you requested that your care partner not be given the details of your procedure findings, then the procedure report has been included in a sealed envelope for you to review at your convenience later.  YOU SHOULD EXPECT: Some feelings of bloating in the abdomen. Passage of more gas than usual.  Walking can help get rid of the air that was put into your GI tract during the procedure and reduce the bloating. If you had a lower endoscopy (such as a colonoscopy or flexible sigmoidoscopy) you may notice spotting of blood in your stool or on the toilet paper. If you underwent a bowel prep for your procedure, you may not have a normal bowel movement for a few days.  Please Note:  You might notice some irritation and congestion in your nose or some drainage.  This is from the oxygen used during your procedure.  There is no need for concern and it should clear up in a day or so.  SYMPTOMS TO REPORT IMMEDIATELY:   Following lower endoscopy (colonoscopy or flexible sigmoidoscopy):  Excessive amounts of blood in the stool  Significant tenderness or worsening of abdominal pains  Swelling of the abdomen that is new, acute  Fever of 100F or higher   For urgent or emergent issues, a gastroenterologist can be reached at any hour by calling [phone].   DIET:  We do recommend a small meal at first, but then you may proceed to your regular diet.  Drink plenty of fluids but you should avoid alcoholic beverages for 24 hours.  ACTIVITY:  You should plan to take it easy for the rest of today and you should NOT DRIVE or use heavy machinery until tomorrow (because of the sedation medicines used during the test).     FOLLOW UP: Our staff will call the number listed on your records the next business day following your procedure to check on you and address any questions or concerns that you may have regarding the information given to you following your procedure. If we do not reach you, we will leave a message.  However, if you are feeling well and you are not experiencing any problems, there is no need to return our call.  We will assume that you have returned to your regular daily activities without incident.  If any biopsies were taken you will be contacted by phone or by letter within the next 1-3 weeks.  Please call us at [phone] if you have not heard about the biopsies in 3 weeks.    SIGNATURES/CONFIDENTIALITY: You and/or your care partner have signed paperwork which will be entered into your electronic medical record.  These signatures attest to the fact that that the information above on your After Visit Summary has been reviewed and is understood.  Full responsibility of the confidentiality of this discharge information lies with you and/or your care-partner.   Handouts were given to your care partner on polyps, diverticulosis, and hemorrhoids. You may resume your current medications today. Await biopsy results. Please call if any questions or concerns.   

## 2017-08-05 NOTE — Progress Notes (Signed)
Called to room to assist during endoscopic procedure.  Patient ID and intended procedure confirmed with present staff. Received instructions for my participation in the procedure from the performing physician.  

## 2017-08-05 NOTE — Op Note (Signed)
[facility] Patient Name: Michelle Davidson Procedure Date: 08/05/2017 1:37 PM MRN: [id number] Endoscopist: Mauri Pole , MD Age: 72 Referring MD:  Date of Birth: [birth year] Gender: Female Account #: [id number] Procedure:                Colonoscopy Indications:              High risk colon cancer surveillance: Personal                            history of colonic polyps, Surveillance: History of                            piecemeal removal adenoma on last colonoscopy (< 3                            yrs) Medicines:                Monitored Anesthesia Care Procedure:                Pre-Anesthesia Assessment:                           - Prior to the procedure, a History and Physical                            was performed, and patient medications and                            allergies were reviewed. The patient's tolerance of                            previous anesthesia was also reviewed. The risks                            and benefits of the procedure and the sedation                            options and risks were discussed with the patient.                            All questions were answered, and informed consent                            was obtained. Prior Anticoagulants: The patient has                            taken no previous anticoagulant or antiplatelet                            agents. ASA Grade Assessment: II - A patient with                            mild systemic disease. After reviewing the risks  and benefits, the patient was deemed in                            satisfactory condition to undergo the procedure.                           After obtaining informed consent, the colonoscope                            was passed under direct vision. Throughout the                            procedure, the patient's blood pressure, pulse, and                            oxygen saturations were monitored continuously. The                             Colonoscope was introduced through the anus and                            advanced to the the cecum, identified by                            appendiceal orifice and ileocecal valve. The                            colonoscopy was performed without difficulty. The                            patient tolerated the procedure well. The quality                            of the bowel preparation was excellent. The                            ileocecal valve, appendiceal orifice, and rectum                            were photographed. Scope In: 1:39:22 PM Scope Out: 2:01:57 PM Scope Withdrawal Time: 0 hours 10 minutes 32 seconds  Total Procedure Duration: 0 hours 22 minutes 35 seconds  Findings:                 The perianal and digital rectal examinations were                            normal.                           Two sessile polyps were found in the descending                            colon and cecum. The polyps were 5 to 7 mm in size.  These polyps were removed with a cold snare.                            Resection and retrieval were complete.                           A 2 mm polyp was found in the transverse colon. The                            polyp was sessile. The polyp was removed with a                            cold biopsy forceps. Resection and retrieval were                            complete.                           Multiple small and large-mouthed diverticula were                            found in the sigmoid colon and descending colon.                            There was narrowing of the colon in association                            with the diverticular opening. There was evidence                            of diverticular spasm. Peri-diverticular erythema                            was seen.                           Non-bleeding internal hemorrhoids were found during                             retroflexion. The hemorrhoids were small. Complications:            No immediate complications. Estimated Blood Loss:     Estimated blood loss was minimal. Impression:               - Two 5 to 7 mm polyps in the descending colon and                            in the cecum, removed with a cold snare. Resected                            and retrieved.                           - One 2 mm polyp in the transverse colon, removed  with a cold biopsy forceps. Resected and retrieved.                           - Severe diverticulosis in the sigmoid colon and in                            the descending colon. There was narrowing of the                            colon in association with the diverticular opening.                            There was evidence of diverticular spasm.                            Peri-diverticular erythema was seen.                           - Non-bleeding internal hemorrhoids. Recommendation:           - Patient has a contact number available for                            emergencies. The signs and symptoms of potential                            delayed complications were discussed with the                            patient. Return to normal activities tomorrow.                            Written discharge instructions were provided to the                            patient.                           - Resume previous diet.                           - Continue present medications.                           - Await pathology results.                           - Repeat colonoscopy in 3 - 5 years for                            surveillance based on pathology results. Mauri Pole, MD 08/05/2017 2:12:09 PM This report has been signed electronically.

## 2017-08-05 NOTE — Progress Notes (Signed)
Report to PACU, RN, vss, BBS= Clear.  

## 2017-08-05 NOTE — Progress Notes (Signed)
Pt's states no medical or surgical changes since previsit or office visit. 

## 2017-08-06 ENCOUNTER — Telehealth: Payer: Self-pay | Admitting: *Deleted

## 2017-08-06 NOTE — Telephone Encounter (Signed)
  Follow up Call-  Call back number 08/05/2017  Post procedure Call Back phone  # [phone]  Permission to leave phone message Yes  Some recent data might be hidden    [facility]

## 2017-08-06 NOTE — Telephone Encounter (Signed)
No answer for second post procedure call back. LEft message for patient to call with questions or concerns. Sm 

## 2017-08-12 ENCOUNTER — Encounter: Payer: Self-pay | Admitting: Gastroenterology

## 2017-08-18 ENCOUNTER — Telehealth: Payer: Self-pay | Admitting: Family Medicine

## 2017-08-18 MED ORDER — ATENOLOL 25 MG PO TABS: 25.0000 mg | ORAL_TABLET | Freq: Two times a day (BID) | ORAL | 1 refills | Status: DC

## 2017-08-18 NOTE — Telephone Encounter (Signed)
Copied from [place] [id number]. Topic: Quick Communication - Rx Refill/Question >> Aug 18, 2017  3:01 PM Margot Ables wrote: Medication: atenolol (TENORMIN) 25 MG tablet - pt takes 1 pill 2 times per day. This was sent to the pharmacy as #90 for 90 day supply. Pt needs #180 for a 90 day supply. Please correct and resend to the pharmacy.  Has the patient contacted their pharmacy? Yes - RX is on hold to be corrected. Preferred Pharmacy (with phone number or street name): [id number] [place], [place] [address] [phone] (Phone) [phone] (Fax)

## 2017-08-18 NOTE — Telephone Encounter (Signed)
Rx sent 

## 2017-08-19 DIAGNOSIS — H4423 Degenerative myopia, bilateral: Secondary | ICD-10-CM | POA: Diagnosis not present

## 2017-08-19 DIAGNOSIS — H43822 Vitreomacular adhesion, left eye: Secondary | ICD-10-CM | POA: Diagnosis not present

## 2017-08-21 ENCOUNTER — Other Ambulatory Visit: Payer: Self-pay | Admitting: Family Medicine

## 2017-08-26 ENCOUNTER — Encounter: Payer: Self-pay | Admitting: Family Medicine

## 2017-09-10 ENCOUNTER — Other Ambulatory Visit: Payer: Self-pay

## 2017-09-10 DIAGNOSIS — Z1159 Encounter for screening for other viral diseases: Secondary | ICD-10-CM

## 2017-09-10 NOTE — Progress Notes (Signed)
Created future order for Hep-C lab draw/thx dmf

## 2017-09-16 ENCOUNTER — Other Ambulatory Visit (INDEPENDENT_AMBULATORY_CARE_PROVIDER_SITE_OTHER): Payer: Medicare Other

## 2017-09-16 DIAGNOSIS — Z1159 Encounter for screening for other viral diseases: Secondary | ICD-10-CM | POA: Diagnosis not present

## 2017-09-17 LAB — HEPATITIS C ANTIBODY
HEP C AB: NONREACTIVE
SIGNAL TO CUT-OFF: 0.02 (ref <1.00)

## 2017-10-19 ENCOUNTER — Encounter: Payer: Self-pay | Admitting: Family Medicine

## 2017-10-19 ENCOUNTER — Ambulatory Visit (INDEPENDENT_AMBULATORY_CARE_PROVIDER_SITE_OTHER): Payer: Medicare Other | Admitting: Family Medicine

## 2017-10-19 VITALS — BP 120/70 | HR 66 | Ht 62.5 in | Wt 156.1 lb

## 2017-10-19 DIAGNOSIS — K589 Irritable bowel syndrome without diarrhea: Secondary | ICD-10-CM | POA: Diagnosis not present

## 2017-10-19 DIAGNOSIS — K5900 Constipation, unspecified: Secondary | ICD-10-CM | POA: Diagnosis not present

## 2017-10-19 DIAGNOSIS — R197 Diarrhea, unspecified: Secondary | ICD-10-CM | POA: Insufficient documentation

## 2017-10-19 DIAGNOSIS — R140 Abdominal distension (gaseous): Secondary | ICD-10-CM | POA: Insufficient documentation

## 2017-10-19 LAB — URINALYSIS, ROUTINE W REFLEX MICROSCOPIC
Bilirubin Urine: NEGATIVE
Hgb urine dipstick: NEGATIVE
KETONES UR: NEGATIVE
LEUKOCYTES UA: NEGATIVE
NITRITE: NEGATIVE
RBC / HPF: NONE SEEN (ref 0–?)
Specific Gravity, Urine: <=1.005 — AB (ref 1.000–1.030)
TOTAL PROTEIN, URINE-UPE24: NEGATIVE
URINE GLUCOSE: NEGATIVE
UROBILINOGEN UA: 0.2 (ref 0.0–1.0)
pH: 7 (ref 5.0–8.0)

## 2017-10-19 LAB — CBC
HCT: 36.6 % (ref 36.0–46.0)
Hemoglobin: 12.8 g/dL (ref 12.0–15.0)
MCHC: 34.8 g/dL (ref 30.0–36.0)
MCV: 89.7 fl (ref 78.0–100.0)
PLATELETS: 307 10*3/uL (ref 150.0–400.0)
RBC: 4.09 Mil/uL (ref 3.87–5.11)
RDW: 12.4 % (ref 11.5–15.5)
WBC: 7 10*3/uL (ref 4.0–10.5)

## 2017-10-19 NOTE — Patient Instructions (Signed)

## 2017-10-19 NOTE — Progress Notes (Signed)
Subjective:  Patient ID: Michelle Davidson, female    DOB: [birth year]  Age: 72 y.o. MRN: [id number]  CC: Rectal Pain   HPI Michelle Davidson presents for evaluation of a 4-day history of pelvic pressure with bloating.  She feels pressure in the perineal area.  Her stool frequency has decreased.  Her stools have been harder and smaller.  Typically her stools are loose.  She has a past medical history of irritable bowel syndrome.  While some of her symptoms were present before she started peppermint oil, most came after initiation of the peppermint oil.  Last colonoscopy was in March of this year.  She has had multiple polyps removed.  She has a history of diverticulosis but no history of diverticulitis.  She denies fevers, nausea and vomiting or blood in her stool.  She is sexually active with a stable partner.  Denies vaginal discharge or bleeding.  Outpatient Medications Prior to Visit  Medication Sig Dispense Refill  . atenolol (TENORMIN) 25 MG tablet Take 1 tablet (25 mg total) by mouth 2 (two) times daily. 180 tablet 1  . fluticasone (FLONASE) 50 MCG/ACT nasal spray PLACE 1 SPRAY INTO BOTH NOSTRILS DAILY. 16 g PRN  . hydrochlorothiazide (MICROZIDE) 12.5 MG capsule Take 1 capsule (12.5 mg total) by mouth daily. 90 capsule 1  . Multiple Vitamins-Minerals (CENTRUM SILVER 50+WOMEN PO) Take 1 tablet by mouth daily.    Marland Kitchen zolmitriptan (ZOMIG-ZMT) 5 MG disintegrating tablet zolmitriptan 5 mg disintegrating tablet  DISSOLVE 1 TABLET ON TONGUE AND SWALLOW AT ONSET OF MIGRAINE MAY REPEAT ONCE AFTER 2HRS MAX 10MG /DAY     Facility-Administered Medications Prior to Visit  Medication Dose Route Frequency Provider Last Rate Last Dose  . 0.9 %  sodium chloride infusion  500 mL Intravenous Once Nandigam, Kavitha V, MD        ROS Review of Systems  Constitutional: Negative for chills, fatigue, fever and unexpected weight change.  HENT: Negative.   Eyes: Negative.   Respiratory: Negative.     Cardiovascular: Negative.   Gastrointestinal: Positive for abdominal distention, abdominal pain and constipation. Negative for anal bleeding, blood in stool, diarrhea, nausea, rectal pain and vomiting.  Endocrine: Negative for cold intolerance and heat intolerance.  Genitourinary: Negative for difficulty urinating, dysuria, frequency, hematuria, vaginal bleeding, vaginal discharge and vaginal pain.  Musculoskeletal: Negative for arthralgias and myalgias.  Skin: Negative for color change, pallor and rash.  Allergic/Immunologic: Negative for immunocompromised state.  Neurological: Negative.   Hematological: Does not bruise/bleed easily.  Psychiatric/Behavioral: Negative.     Objective:  BP 120/70   Pulse 66   Ht 5' 2.5" (1.588 m)   Wt 156 lb 2 oz (70.8 kg)   SpO2 97%   BMI 28.10 kg/m   BP Readings from Last 3 Encounters:  10/19/17 120/70  08/05/17 (!) 121/43  07/06/17 128/70    Wt Readings from Last 3 Encounters:  10/19/17 156 lb 2 oz (70.8 kg)  08/05/17 158 lb (71.7 kg)  07/06/17 158 lb 8 oz (71.9 kg)    Physical Exam  Constitutional: She is oriented to person, place, and time. She appears well-developed and well-nourished. No distress.  HENT:  Head: Normocephalic and atraumatic.  Right Ear: External ear normal.  Left Ear: External ear normal.  Mouth/Throat: Oropharynx is clear and moist. No oropharyngeal exudate.  Eyes: Pupils are equal, round, and reactive to light. Conjunctivae and EOM are normal. Right eye exhibits no discharge. Left eye exhibits no discharge. No scleral icterus.  Neck: Normal range of motion. No JVD present. No tracheal deviation present. No thyromegaly present.  Cardiovascular: Normal rate, regular rhythm and normal heart sounds.  Pulmonary/Chest: Effort normal and breath sounds normal.  Abdominal: Soft. Bowel sounds are normal. She exhibits distension. There is no tenderness. There is no rebound and no guarding.  Lymphadenopathy:    She has no  cervical adenopathy.  Neurological: She is alert and oriented to person, place, and time.  Skin: Skin is warm and dry. She is not diaphoretic. No erythema. No pallor.  Psychiatric: She has a normal mood and affect. Her behavior is normal.    Lab Results  Component Value Date   WBC 5.6 01/21/2016   HGB 13.4 01/21/2016   HCT 39 01/21/2016   PLT 271 01/21/2016    No results found.  Assessment & Plan:   Michelle Davidson was seen today for rectal pain.  Diagnoses and all orders for this visit:  Irritable bowel syndrome, unspecified type -     CBC  Bloating -     CBC -     Urinalysis, Routine w reflex microscopic  Constipation, unspecified constipation type -     CBC -     Urinalysis, Routine w reflex microscopic   I am having Michelle Davidson maintain her Multiple Vitamins-Minerals (CENTRUM SILVER 50+WOMEN PO), zolmitriptan, hydrochlorothiazide, atenolol, and fluticasone. We will continue to administer sodium chloride.  No orders of the defined types were placed in this encounter.  Do wonder if the peppermint oil is responsible for some of her symptoms.  She will consider holding it.  Told her that she can try some Colace or MiraLAX and that these may help her reassume her normal stooling pattern.  CBC and urinalysis are pending.  She will return the next 4 to 5 days if her symptoms do not improve with stooling and the passage of gas.  Follow-up: Return in about 1 week (around 10/26/2017), or if symptoms worsen or fail to improve, for may try colace or miralax. may try holding peppermint oil.Libby Maw, MD

## 2017-10-20 ENCOUNTER — Encounter: Payer: Self-pay | Admitting: Family Medicine

## 2017-11-14 ENCOUNTER — Other Ambulatory Visit: Payer: Self-pay | Admitting: Family Medicine

## 2017-11-24 DIAGNOSIS — H43811 Vitreous degeneration, right eye: Secondary | ICD-10-CM | POA: Diagnosis not present

## 2017-11-24 DIAGNOSIS — H4423 Degenerative myopia, bilateral: Secondary | ICD-10-CM | POA: Diagnosis not present

## 2017-11-24 DIAGNOSIS — H43822 Vitreomacular adhesion, left eye: Secondary | ICD-10-CM | POA: Diagnosis not present

## 2017-11-24 DIAGNOSIS — H2513 Age-related nuclear cataract, bilateral: Secondary | ICD-10-CM | POA: Diagnosis not present

## 2017-12-15 ENCOUNTER — Other Ambulatory Visit: Payer: Self-pay | Admitting: Family Medicine

## 2017-12-15 NOTE — Telephone Encounter (Signed)
Rx refill request: zolmitriptan 5 mg      Historical provider     Last ordered: 05/19/17   LOV: 10/19/17  PCP: [place]: verified

## 2017-12-15 NOTE — Telephone Encounter (Signed)
Copied from [id number]. Topic: Quick Communication - Rx Refill/Question >> Dec 15, 2017  1:18 PM Synthia Innocent wrote: Medication: zolmitriptan (ZOMIG-ZMT) 5 MG disintegrating tablet, only needs 6 tablets  Has the patient contacted their pharmacy? Yes.   (Agent: If no, request that the patient contact the pharmacy for the refill.) (Agent: If yes, when and what did the pharmacy advise?)  [facility]erred Pharm[facility] (with phone number [facility] [address] Please be advised that RX refills may take up to 3 business days. We ask that you follow-up with your pharmacy.

## 2017-12-16 MED ORDER — ZOLMITRIPTAN 5 MG PO TBDP: ORAL_TABLET | ORAL | 2 refills | Status: DC

## 2018-02-02 ENCOUNTER — Encounter: Payer: Self-pay | Admitting: Obstetrics & Gynecology

## 2018-02-02 ENCOUNTER — Ambulatory Visit (INDEPENDENT_AMBULATORY_CARE_PROVIDER_SITE_OTHER): Payer: Medicare Other | Admitting: Obstetrics & Gynecology

## 2018-02-02 VITALS — BP 134/76 | Wt 159.0 lb

## 2018-02-02 DIAGNOSIS — Z124 Encounter for screening for malignant neoplasm of cervix: Secondary | ICD-10-CM | POA: Diagnosis not present

## 2018-02-02 DIAGNOSIS — E663 Overweight: Secondary | ICD-10-CM

## 2018-02-02 DIAGNOSIS — Z13820 Encounter for screening for osteoporosis: Secondary | ICD-10-CM

## 2018-02-02 DIAGNOSIS — Z01419 Encounter for gynecological examination (general) (routine) without abnormal findings: Secondary | ICD-10-CM | POA: Diagnosis not present

## 2018-02-02 DIAGNOSIS — Z780 Asymptomatic menopausal state: Secondary | ICD-10-CM

## 2018-02-02 NOTE — Progress Notes (Signed)
Michelle Davidson [birth year] [id number]   History:    72 y.o. G2P2L2 Widowed  RP:  New (>3 yrs) patient presenting for annual gyn exam   HPI: Menopause, well on no HRT.  No postmenopausal bleeding.  No pelvic pain.  Abstinent.  Urine and bowel movements normal.  Breasts normal.  Body mass index 28.62.  Difficulty losing weight.  Would like to do fasting health labs here.  Past medical history,surgical history, family history and social history were all reviewed and documented in the EPIC chart.  Gynecologic History No LMP recorded. Patient is postmenopausal. Contraception: post menopausal status Last Pap: 2017. Results were: normal Last mammogram: 2018. Results were: normal Bone Density: Will schedule here now Colonoscopy: 2019  Obstetric History OB History  Gravida Para Term Preterm AB Living  2 2       2   SAB TAB Ectopic Multiple Live Births               # Outcome Date GA Lbr Len/2nd Weight Sex Delivery Anes PTL Lv  2 Para           1 Para              ROS: A ROS was performed and pertinent positives and negatives are included in the history.  GENERAL: No fevers or chills. HEENT: No change in vision, no earache, sore throat or sinus congestion. NECK: No pain or stiffness. CARDIOVASCULAR: No chest pain or pressure. No palpitations. PULMONARY: No shortness of breath, cough or wheeze. GASTROINTESTINAL: No abdominal pain, nausea, vomiting or diarrhea, melena or bright red blood per rectum. GENITOURINARY: No urinary frequency, urgency, hesitancy or dysuria. MUSCULOSKELETAL: No joint or muscle pain, no back pain, no recent trauma. DERMATOLOGIC: No rash, no itching, no lesions. ENDOCRINE: No polyuria, polydipsia, no heat or cold intolerance. No recent change in weight. HEMATOLOGICAL: No anemia or easy bruising or bleeding. NEUROLOGIC: No headache, seizures, numbness, tingling or weakness. PSYCHIATRIC: No depression, no loss of interest in normal activity or change in sleep pattern.     Exam:   BP 134/76   Wt 159 lb (72.1 kg)   BMI 28.62 kg/m   Body mass index is 28.62 kg/m.  General appearance : Well developed well nourished female. No acute distress HEENT: Eyes: no retinal hemorrhage or exudates,  Neck supple, trachea midline, no carotid bruits, no thyroidmegaly Lungs: Clear to auscultation, no rhonchi or wheezes, or rib retractions  Heart: Regular rate and rhythm, no murmurs or gallops Breast:Examined in sitting and supine position were symmetrical in appearance, no palpable masses or tenderness,  no skin retraction, no nipple inversion, no nipple discharge, no skin discoloration, no axillary or supraclavicular lymphadenopathy Abdomen: no palpable masses or tenderness, no rebound or guarding Extremities: no edema or skin discoloration or tenderness  Pelvic: Vulva: Normal             Vagina: No gross lesions or discharge  Cervix: No gross lesions or discharge  Uterus  AV, normal size, shape and consistency, non-tender and mobile  Adnexa  Without masses or tenderness  Anus: Normal   Assessment/Plan:  72 y.o. female for annual exam   1. Encounter for routine gynecological examination with Papanicolaou smear of cervix Normal gynecologic exam in menopause.  Normal Pap test in 2017.  No indication to repeat this year.  Screening Mammo to schedule at [facility].  Colonoscopy in 2019.  Will come back for Fasting Health Labs if covered by insurance.  2. Post-menopausal Well on  no hormone replacement therapy.  No postmenopausal bleeding.  3. Screening for osteoporosis Will schedule bone density here now.  Recommend vitamin D supplements, calcium intake of 1.5 g/day and regular weightbearing physical activity. - DG Bone Density; Future  4. Overweight (BMI 25.0-29.9) Recommend lower calorie/carb nutrition with [employer].  Recommend aerobic physical activity 5 times a week and weightlifting every 2 days.  Princess Bruins MD, 10:46 AM 02/02/2018

## 2018-02-04 ENCOUNTER — Encounter: Payer: Self-pay | Admitting: Family Medicine

## 2018-02-04 DIAGNOSIS — Z23 Encounter for immunization: Secondary | ICD-10-CM | POA: Diagnosis not present

## 2018-02-04 LAB — PAP IG W/ RFLX HPV ASCU

## 2018-02-05 ENCOUNTER — Encounter: Payer: Self-pay | Admitting: *Deleted

## 2018-02-07 ENCOUNTER — Encounter: Payer: Self-pay | Admitting: Obstetrics & Gynecology

## 2018-02-07 NOTE — Patient Instructions (Signed)
1. Encounter for routine gynecological examination with Papanicolaou smear of cervix Normal gynecologic exam in menopause.  Normal Pap test in 2017.  No indication to repeat this year.  Screening Mammo to schedule at [facility].  Colonoscopy in 2019.  Will come back for Fasting Health Labs if covered by insurance.  2. Post-menopausal Well on no hormone replacement therapy.  No postmenopausal bleeding.  3. Screening for osteoporosis Will schedule bone density here now.  Recommend vitamin D supplements, calcium intake of 1.5 g/day and regular weightbearing physical activity. - DG Bone Density; Future  4. Overweight (BMI 25.0-29.9) Recommend lower calorie/carb nutrition with [employer].  Recommend aerobic physical activity 5 times a week and weightlifting every 2 days.  Michelle Davidson, it was a pleasure seeing you today!  I will inform you of your bone density results as soon as available.

## 2018-02-12 ENCOUNTER — Encounter: Payer: Self-pay | Admitting: Family Medicine

## 2018-02-23 ENCOUNTER — Ambulatory Visit: Payer: Medicare Other | Admitting: Family Medicine

## 2018-02-23 DIAGNOSIS — H4423 Degenerative myopia, bilateral: Secondary | ICD-10-CM | POA: Diagnosis not present

## 2018-03-01 ENCOUNTER — Ambulatory Visit (INDEPENDENT_AMBULATORY_CARE_PROVIDER_SITE_OTHER): Payer: Medicare Other | Admitting: Family Medicine

## 2018-03-01 ENCOUNTER — Encounter: Payer: Self-pay | Admitting: Family Medicine

## 2018-03-01 VITALS — BP 122/64 | HR 54 | Temp 97.6°F | Ht 62.5 in | Wt 158.0 lb

## 2018-03-01 DIAGNOSIS — R739 Hyperglycemia, unspecified: Secondary | ICD-10-CM | POA: Diagnosis not present

## 2018-03-01 DIAGNOSIS — R7309 Other abnormal glucose: Secondary | ICD-10-CM

## 2018-03-01 LAB — POCT GLYCOSYLATED HEMOGLOBIN (HGB A1C): Hemoglobin A1C: 5.5 % (ref 4.0–5.6)

## 2018-03-01 NOTE — Patient Instructions (Signed)
Great to see you.  Please call back to schedule a complete physical.

## 2018-03-01 NOTE — Progress Notes (Signed)
Subjective:   Patient ID: [name], female    DOB: [birth year], 72 y.o.   MRN: [id number]  Michelle Davidson is a pleasant 72 y.o. year old female who presents to clinic today with Other (Checked her glucose levels two weeks at her uncle's house-ate a meal three hours later-it was 196. Denies symptoms. She has been feeling fatigue-decreased energy levels.)  on 03/01/2018  HPI:  Hyperglycemia- Was at her uncle's house who is a diabetic and she was just curious so she had him check her FSBS- it was 196. Had eaten 2 hours earlier. Denies increased thirst or urination.  Does have a family history of diabetes.  Lab Results  Component Value Date   HGBA1C 5.5 03/01/2018   Current Outpatient Medications on File Prior to Visit  Medication Sig Dispense Refill  . atenolol (TENORMIN) 25 MG tablet Take 1 tablet (25 mg total) by mouth 2 (two) times daily. 180 tablet 1  . fluticasone (FLONASE) 50 MCG/ACT nasal spray PLACE 1 SPRAY INTO BOTH NOSTRILS DAILY. 16 g PRN  . hydrochlorothiazide (MICROZIDE) 12.5 MG capsule TAKE 1 CAPSULE BY MOUTH EVERY DAY 90 capsule 1  . Multiple Vitamins-Minerals (CENTRUM SILVER 50+WOMEN PO) Take 1 tablet by mouth daily.    Marland Kitchen zolmitriptan (ZOMIG-ZMT) 5 MG disintegrating tablet zolmitriptan 5 mg disintegrating tablet  DISSOLVE 1 TABLET ON TONGUE AND SWALLOW AT ONSET OF MIGRAINE MAY REPEAT ONCE AFTER 2HRS MAX 10MG /DAY 10 tablet 2   Current Facility-Administered Medications on File Prior to Visit  Medication Dose Route Frequency Provider Last Rate Last Dose  . 0.9 %  sodium chloride infusion  500 mL Intravenous Once Nandigam, Venia Minks, MD        Allergies  Allergen Reactions  . Esomeprazole Magnesium     Syncope  . Lipitor [Atorvastatin Calcium]     Muscle aches  . Zocor [Simvastatin]     Muscle aches  . Nexium [Esomeprazole Magnesium]   . Avelox [Moxifloxacin Hcl In Nacl] Rash    Past Medical History:  Diagnosis Date  . Arthritis   . Basal cell  carcinoma   . Choroidal neovascularization    Right eye  . CNVM (choroidal neovascular membrane), right   . Fibromyalgia   . History of chicken pox   . History of colon polyps   . Hyperlipidemia   . Hypertension   . Migraines   . PAC (premature atrial contraction)   . PVC (premature ventricular contraction)     Past Surgical History:  Procedure Laterality Date  . [place] Right 2010  . BREAST BIOPSY Left 2011   Titanium chip implanted to mark the spot/benign findings  . TUBAL LIGATION  1978  . WISDOM TOOTH EXTRACTION      Family History  Problem Relation Age of Onset  . Arthritis Mother   . Hyperlipidemia Mother   . Hypertension Mother   . Colon cancer Father   . Arthritis Maternal Grandmother   . Heart attack Maternal Grandfather   . Lung cancer Maternal Grandfather        Smoker  . Diabetes Paternal Grandmother   . Uterine cancer Paternal Grandmother   . Skin cancer Paternal Grandfather   . Diabetes Paternal Uncle   . Kidney cancer Paternal Uncle   . Stomach cancer Neg Hx   . Esophageal cancer Neg Hx     Social History   Socioeconomic History  . Marital status: Widowed    Spouse name: Not on file  . Number of  children: 2  . Years of education: Not on file  . Highest education level: Not on file  Occupational History  . Occupation: retired  Scientific laboratory technician  . Financial resource strain: Not on file  . Food insecurity:    Worry: Not on file    Inability: Not on file  . Transportation needs:    Medical: Not on file    Non-medical: Not on file  Tobacco Use  . Smoking status: Former Smoker    Types: Cigarettes    Last attempt to quit: 1997    Years since quitting: 22.8  . Smokeless tobacco: Never Used  Substance and Sexual Activity  . Alcohol use: Yes    Comment: cocktail every night  . Drug use: No  . Sexual activity: Not Currently    Partners: Male    Comment: 1st intercourse- 59, partners- 66, widow  Lifestyle  . Physical activity:     Days per week: Not on file    Minutes per session: Not on file  . Stress: Not on file  Relationships  . Social connections:    Talks on phone: Not on file    Gets together: Not on file    Attends religious service: Not on file    Active member of club or organization: Not on file    Attends meetings of clubs or organizations: Not on file    Relationship status: Not on file  . Intimate partner violence:    Fear of current or ex partner: Not on file    Emotionally abused: Not on file    Physically abused: Not on file    Forced sexual activity: Not on file  Other Topics Concern  . Not on file  Social History Narrative  . Not on file   The PMH, PSH, Social History, Family History, Medications, and allergies have been reviewed in [facility], and have been updated if relevant.   Review of Systems  Gastrointestinal: Negative.   Endocrine: Negative.   Neurological: Negative.   Psychiatric/Behavioral: Negative.   All other systems reviewed and are negative.      Objective:    BP 122/64   Pulse (!) 54   Temp 97.6 F (36.4 C) (Oral)   Ht 5' 2.5" (1.588 m)   Wt 158 lb (71.7 kg)   SpO2 96%   BMI 28.44 kg/m    Physical Exam  Constitutional: She is oriented to person, place, and time. She appears well-developed and well-nourished. No distress.  HENT:  Head: Normocephalic and atraumatic.  Eyes: EOM are normal.  Cardiovascular: Normal rate.  Pulmonary/Chest: Effort normal.  Musculoskeletal: Normal range of motion.  Neurological: She is alert and oriented to person, place, and time. No cranial nerve deficit.  Skin: Skin is warm and dry. She is not diaphoretic.  Psychiatric: She has a normal mood and affect. Her behavior is normal. Judgment and thought content normal.  Nursing note and vitals reviewed.         Assessment & Plan:   Elevated glucose - Plan: POCT HgB A1C No follow-ups on file.

## 2018-03-01 NOTE — Assessment & Plan Note (Signed)
a1c is normal.  Reassurance provided. She is following up with me for CPX soon and we will repeat it at that time. She will call me before that if she develops any additional symptoms.

## 2018-03-02 DIAGNOSIS — H2513 Age-related nuclear cataract, bilateral: Secondary | ICD-10-CM | POA: Diagnosis not present

## 2018-03-02 DIAGNOSIS — H43822 Vitreomacular adhesion, left eye: Secondary | ICD-10-CM | POA: Diagnosis not present

## 2018-03-02 DIAGNOSIS — H18413 Arcus senilis, bilateral: Secondary | ICD-10-CM | POA: Diagnosis not present

## 2018-03-02 DIAGNOSIS — H25013 Cortical age-related cataract, bilateral: Secondary | ICD-10-CM | POA: Diagnosis not present

## 2018-03-02 DIAGNOSIS — H25043 Posterior subcapsular polar age-related cataract, bilateral: Secondary | ICD-10-CM | POA: Diagnosis not present

## 2018-03-02 DIAGNOSIS — H2511 Age-related nuclear cataract, right eye: Secondary | ICD-10-CM | POA: Diagnosis not present

## 2018-03-05 ENCOUNTER — Encounter: Payer: Self-pay | Admitting: Family Medicine

## 2018-03-11 ENCOUNTER — Other Ambulatory Visit: Payer: Self-pay

## 2018-03-11 DIAGNOSIS — R7309 Other abnormal glucose: Secondary | ICD-10-CM

## 2018-03-11 DIAGNOSIS — R739 Hyperglycemia, unspecified: Secondary | ICD-10-CM

## 2018-03-11 DIAGNOSIS — E785 Hyperlipidemia, unspecified: Secondary | ICD-10-CM

## 2018-03-11 DIAGNOSIS — I10 Essential (primary) hypertension: Secondary | ICD-10-CM

## 2018-03-28 HISTORY — PX: EYE SURGERY: SHX253

## 2018-03-29 DIAGNOSIS — H2511 Age-related nuclear cataract, right eye: Secondary | ICD-10-CM | POA: Diagnosis not present

## 2018-03-30 DIAGNOSIS — H2512 Age-related nuclear cataract, left eye: Secondary | ICD-10-CM | POA: Diagnosis not present

## 2018-04-12 DIAGNOSIS — H2512 Age-related nuclear cataract, left eye: Secondary | ICD-10-CM | POA: Diagnosis not present

## 2018-05-08 ENCOUNTER — Other Ambulatory Visit: Payer: Self-pay | Admitting: Family Medicine

## 2018-06-02 ENCOUNTER — Ambulatory Visit: Payer: Medicare Other | Admitting: *Deleted

## 2018-06-02 DIAGNOSIS — H43823 Vitreomacular adhesion, bilateral: Secondary | ICD-10-CM | POA: Diagnosis not present

## 2018-06-02 DIAGNOSIS — H43811 Vitreous degeneration, right eye: Secondary | ICD-10-CM | POA: Diagnosis not present

## 2018-06-02 DIAGNOSIS — H4423 Degenerative myopia, bilateral: Secondary | ICD-10-CM | POA: Diagnosis not present

## 2018-06-02 DIAGNOSIS — H35342 Macular cyst, hole, or pseudohole, left eye: Secondary | ICD-10-CM | POA: Diagnosis not present

## 2018-06-08 ENCOUNTER — Ambulatory Visit: Payer: Medicare Other | Admitting: Family Medicine

## 2018-06-08 NOTE — Progress Notes (Signed)
Subjective:   Michelle Davidson is a 73 y.o. female who presents for an Initial Medicare Annual Wellness Visit.  The Patient was informed that the wellness visit is to identify future health risk and educate and initiate measures that can reduce risk for increased disease through the lifespan.   Describes health as fair, good or great? good  Involved in a bridge group. Loves to read. Lost husband in 2012.  Has had boyfriend for 7 yrs.  Review of Systems   No ROS.  Medicare Wellness Visit. Additional risk factors are reflected in the social history. Cardiac Risk Factors include: advanced age (>18men, >66 women);dyslipidemia;hypertension Sleep patterns:   No issues Home Safety/Smoke Alarms: Feels safe in home. Smoke alarms in place.  Lives in 1 story home with boyfriend and cat. Walk-in shower.   Female:   Pap- active order      Mammo-  ordered   Dexa scan-  ordered      CCS- done 07/2017. 3 yr recall Dentist- Dr.Minor every 6 months.     Objective:    Today's Vitals   06/09/18 0937  BP: 130/64  Pulse: (!) 57  SpO2: 97%  Weight: 159 lb (72.1 kg)  Height: 5\' 3"  (1.6 m)   Body mass index is 28.17 kg/m.  Advanced Directives 06/09/2018  Does Patient Have a Medical Advance Directive? Yes  Type of Paramedic of [place];Living will  Does patient want to make changes to medical advance directive? No - Patient declined  Copy of [place] in Chart? No - copy requested    Current Medications (verified) Outpatient Encounter Medications as of 06/09/2018  Medication Sig  . atenolol (TENORMIN) 25 MG tablet TAKE 1 TABLET BY MOUTH TWICE A DAY  . fluticasone (FLONASE) 50 MCG/ACT nasal spray PLACE 1 SPRAY INTO BOTH NOSTRILS DAILY.  . hydrochlorothiazide (MICROZIDE) 12.5 MG capsule TAKE 1 CAPSULE BY MOUTH EVERY DAY  . Multiple Vitamins-Minerals (CENTRUM SILVER 50+WOMEN PO) Take 1 tablet by mouth daily.  Marland Kitchen zolmitriptan (ZOMIG-ZMT) 5 MG  disintegrating tablet zolmitriptan 5 mg disintegrating tablet  DISSOLVE 1 TABLET ON TONGUE AND SWALLOW AT ONSET OF MIGRAINE MAY REPEAT ONCE AFTER 2HRS MAX 10MG /DAY   Facility-Administered Encounter Medications as of 06/09/2018  Medication  . 0.9 %  sodium chloride infusion    Allergies (verified) Esomeprazole magnesium; Lipitor [atorvastatin calcium]; Zocor [simvastatin]; Nexium [esomeprazole magnesium]; and Avelox [moxifloxacin hcl in nacl]   History: Past Medical History:  Diagnosis Date  . Arthritis   . Basal cell carcinoma   . Choroidal neovascularization    Right eye  . CNVM (choroidal neovascular membrane), right   . Fibromyalgia   . History of chicken pox   . History of colon polyps   . Hyperlipidemia   . Hypertension   . Migraines   . PAC (premature atrial contraction)   . PVC (premature ventricular contraction)    Past Surgical History:  Procedure Laterality Date  . [place] Right 2010  . BREAST BIOPSY Left 2011   Titanium chip implanted to mark the spot/benign findings  . EYE SURGERY Bilateral 03/28/2018   cataract removal with lens implants. Dr.Beavis  . TUBAL LIGATION  1978  . WISDOM TOOTH EXTRACTION     Family History  Problem Relation Age of Onset  . Arthritis Mother   . Hyperlipidemia Mother   . Hypertension Mother   . Colon cancer Father   . Arthritis Maternal Grandmother   . Heart attack Maternal Grandfather   .  Lung cancer Maternal Grandfather        Smoker  . Diabetes Paternal Grandmother   . Uterine cancer Paternal Grandmother   . Skin cancer Paternal Grandfather   . Diabetes Paternal Uncle   . Kidney cancer Paternal Uncle   . Stomach cancer Neg Hx   . Esophageal cancer Neg Hx    Social History   Socioeconomic History  . Marital status: Widowed    Spouse name: Not on file  . Number of children: 2  . Years of education: Not on file  . Highest education level: Not on file  Occupational History  . Occupation: retired    Scientific laboratory technician  . Financial resource strain: Not on file  . Food insecurity:    Worry: Not on file    Inability: Not on file  . Transportation needs:    Medical: Not on file    Non-medical: Not on file  Tobacco Use  . Smoking status: Former Smoker    Types: Cigarettes    Last attempt to quit: 1997    Years since quitting: 23.1  . Smokeless tobacco: Never Used  Substance and Sexual Activity  . Alcohol use: Yes    Comment: cocktail every night  . Drug use: No  . Sexual activity: Yes    Partners: Male    Comment: 1st intercourse- 6, partners- 41, widow  Lifestyle  . Physical activity:    Days per week: Not on file    Minutes per session: Not on file  . Stress: Not on file  Relationships  . Social connections:    Talks on phone: Not on file    Gets together: Not on file    Attends religious service: Not on file    Active member of club or organization: Not on file    Attends meetings of clubs or organizations: Not on file    Relationship status: Not on file  Other Topics Concern  . Not on file  Social History Narrative  . Not on file    Tobacco Counseling Counseling given: Not Answered   Clinical Intake:     Pain : No/denies pain                  Activities of Daily Living In your present state of health, do you have any difficulty performing the following activities: 06/09/2018 06/09/2018  Hearing? Y N  Vision? N N  Comment wears readers -  Difficulty concentrating or making decisions? N N  Walking or climbing stairs? N N  Dressing or bathing? N N  Doing errands, shopping? N N  Preparing Food and eating ? N -  Using the Toilet? N -  In the past six months, have you accidently leaked urine? N -  Do you have problems with loss of bowel control? N -  Managing your Medications? N -  Managing your Finances? N -  Housekeeping or managing your Housekeeping? N -  Some recent data might be hidden     Immunizations and Health Maintenance Immunization  History  Administered Date(s) Administered  . Influenza Split 01/27/2011, 01/27/2012, 01/26/2013, 01/26/2014, 02/27/2015  . Influenza, High Dose Seasonal PF 02/09/2017, 02/04/2018  . Pneumococcal Conjugate-13 07/27/2013  . Pneumococcal Polysaccharide-23 04/28/2009  . Tdap 12/15/2016  . Zoster Recombinat (Shingrix) 09/03/2017, 11/30/2017   Health Maintenance Due  Topic Date Due  . DEXA SCAN  07/10/2009  . MAMMOGRAM  10/26/2017    Patient Care Team: Lucille Passy, MD as PCP - General (  Family Medicine)  Indicate any recent Medical Services you may have received from other than [facility] providers in the past year (date may be approximate).     Assessment:   This is a routine wellness examination for Carlis. Physical assessment deferred to PCP.  Hearing/Vision screen No exam data present  Dietary issues and exercise activities discussed: Current Exercise Habits: The patient does not participate in regular exercise at present, Exercise limited by: None identified Diet (meal preparation, eat out, water intake, caffeinated beverages, dairy products, fruits and vegetables): 24 hr recall Breakfast: normally skips. 3 cups of coffee ( 1/2 decaf) Lunch: sandwich and fruit Dinner:  Meat and 2 veggies Drinks plenty of water.  Goals    . Increase physical activity      Depression Screen PHQ 2/9 Scores 06/09/2018 05/19/2017  PHQ - 2 Score 0 6  PHQ- 9 Score - 14    Fall Risk Fall Risk  06/09/2018 06/09/2018 05/19/2017  Falls in the past year? 0 0 No  Follow up - Falls evaluation completed -    Cognitive Function:Ad8 score reviewed for issues:  Issues making decisions:no  Less interest in hobbies / activities:no  Repeats questions, stories (family complaining):no  Trouble using ordinary gadgets (microwave, computer, phone):no  Forgets the month or year: no  Mismanaging finances: no  Remembering appts:no  Daily problems with thinking and/or memory:no Ad8 score is=0         Screening Tests Health Maintenance  Topic Date Due  . DEXA SCAN  07/10/2009  . MAMMOGRAM  10/26/2017  . COLONOSCOPY  08/05/2020  . TETANUS/TDAP  12/16/2026  . INFLUENZA VACCINE  Completed  . Hepatitis C Screening  Completed  . PNA vac Low Risk Adult  Discontinued      Plan:    Please schedule your next medicare wellness visit with me in 1 yr.  Continue to eat heart healthy diet (full of fruits, vegetables, whole grains, lean protein, water--limit salt, fat, and sugar intake) and increase physical activity as tolerated.  Continue doing brain stimulating activities   Bring a copy of your living will and/or healthcare power of attorney to your next office visit.  I have ordered your mammogram and bone density. Please schedule.   I have personally reviewed and noted the following in the patient's chart:   . Medical and social history . Use of alcohol, tobacco or illicit drugs  . Current medications and supplements . Functional ability and status . Nutritional status . Physical activity . Advanced directives . List of other physicians . Hospitalizations, surgeries, and ER visits in previous 12 months . Vitals . Screenings to include cognitive, depression, and falls . Referrals and appointments  In addition, I have reviewed and discussed with patient certain preventive protocols, quality metrics, and best practice recommendations. A written personalized care plan for preventive services as well as general preventive health recommendations were provided to patient.     Naaman Plummer [place], [place]   06/09/2018

## 2018-06-09 ENCOUNTER — Encounter: Payer: Self-pay | Admitting: *Deleted

## 2018-06-09 ENCOUNTER — Other Ambulatory Visit: Payer: Self-pay | Admitting: Family Medicine

## 2018-06-09 ENCOUNTER — Ambulatory Visit (INDEPENDENT_AMBULATORY_CARE_PROVIDER_SITE_OTHER): Payer: Medicare Other | Admitting: Family Medicine

## 2018-06-09 ENCOUNTER — Ambulatory Visit (INDEPENDENT_AMBULATORY_CARE_PROVIDER_SITE_OTHER): Payer: Medicare Other | Admitting: *Deleted

## 2018-06-09 ENCOUNTER — Ambulatory Visit: Payer: Medicare Other | Admitting: Family Medicine

## 2018-06-09 ENCOUNTER — Encounter: Payer: Self-pay | Admitting: Family Medicine

## 2018-06-09 VITALS — BP 130/64 | HR 57 | Ht 63.0 in | Wt 159.0 lb

## 2018-06-09 VITALS — BP 130/64 | HR 57 | Temp 97.7°F | Ht 63.0 in | Wt 159.6 lb

## 2018-06-09 DIAGNOSIS — I10 Essential (primary) hypertension: Secondary | ICD-10-CM | POA: Diagnosis not present

## 2018-06-09 DIAGNOSIS — Z780 Asymptomatic menopausal state: Secondary | ICD-10-CM | POA: Diagnosis not present

## 2018-06-09 DIAGNOSIS — Z1239 Encounter for other screening for malignant neoplasm of breast: Secondary | ICD-10-CM | POA: Diagnosis not present

## 2018-06-09 DIAGNOSIS — E785 Hyperlipidemia, unspecified: Secondary | ICD-10-CM

## 2018-06-09 DIAGNOSIS — Z0000 Encounter for general adult medical examination without abnormal findings: Secondary | ICD-10-CM

## 2018-06-09 DIAGNOSIS — R197 Diarrhea, unspecified: Secondary | ICD-10-CM | POA: Diagnosis not present

## 2018-06-09 DIAGNOSIS — G43109 Migraine with aura, not intractable, without status migrainosus: Secondary | ICD-10-CM | POA: Diagnosis not present

## 2018-06-09 DIAGNOSIS — R739 Hyperglycemia, unspecified: Secondary | ICD-10-CM

## 2018-06-09 LAB — CBC WITH DIFFERENTIAL/PLATELET
BASOS PCT: 1.3 % (ref 0.0–3.0)
Basophils Absolute: 0.1 10*3/uL (ref 0.0–0.1)
EOS ABS: 0.2 10*3/uL (ref 0.0–0.7)
Eosinophils Relative: 2.9 % (ref 0.0–5.0)
HEMATOCRIT: 39.6 % (ref 36.0–46.0)
Hemoglobin: 13.4 g/dL (ref 12.0–15.0)
LYMPHS PCT: 23.3 % (ref 12.0–46.0)
Lymphs Abs: 1.3 10*3/uL (ref 0.7–4.0)
MCHC: 33.9 g/dL (ref 30.0–36.0)
MCV: 91.8 fl (ref 78.0–100.0)
MONO ABS: 0.5 10*3/uL (ref 0.1–1.0)
Monocytes Relative: 9.2 % (ref 3.0–12.0)
Neutro Abs: 3.5 10*3/uL (ref 1.4–7.7)
Neutrophils Relative %: 63.3 % (ref 43.0–77.0)
Platelets: 292 10*3/uL (ref 150.0–400.0)
RBC: 4.32 Mil/uL (ref 3.87–5.11)
RDW: 12 % (ref 11.5–15.5)
WBC: 5.6 10*3/uL (ref 4.0–10.5)

## 2018-06-09 LAB — COMPREHENSIVE METABOLIC PANEL
ALT: 17 U/L (ref 0–35)
AST: 21 U/L (ref 0–37)
Albumin: 4.3 g/dL (ref 3.5–5.2)
Alkaline Phosphatase: 59 U/L (ref 39–117)
BUN: 14 mg/dL (ref 6–23)
CO2: 27 mEq/L (ref 19–32)
Calcium: 9.6 mg/dL (ref 8.4–10.5)
Chloride: 99 mEq/L (ref 96–112)
Creatinine, Ser: 0.75 mg/dL (ref 0.40–1.20)
GFR: 75.56 mL/min (ref >60.00)
GLUCOSE: 98 mg/dL (ref 70–99)
Potassium: 4.2 mEq/L (ref 3.5–5.1)
Sodium: 138 mEq/L (ref 135–145)
Total Bilirubin: 0.5 mg/dL (ref 0.2–1.2)
Total Protein: 7.1 g/dL (ref 6.0–8.3)

## 2018-06-09 LAB — HEMOGLOBIN A1C: Hgb A1c MFr Bld: 5.8 % (ref 4.6–6.5)

## 2018-06-09 LAB — LIPID PANEL
CHOLESTEROL: 209 mg/dL — AB (ref 0–200)
HDL: 65.7 mg/dL (ref >39.00)
LDL CALC: 129 mg/dL — AB (ref 0–99)
NonHDL: 143.24
Total CHOL/HDL Ratio: 3
Triglycerides: 71 mg/dL (ref 0.0–149.0)
VLDL: 14.2 mg/dL (ref 0.0–40.0)

## 2018-06-09 LAB — TSH: TSH: 0.93 u[IU]/mL (ref 0.35–4.50)

## 2018-06-09 MED ORDER — ZOLMITRIPTAN 5 MG PO TBDP: ORAL_TABLET | ORAL | 2 refills | Status: DC

## 2018-06-09 NOTE — Assessment & Plan Note (Signed)
Has been diet controlled. Due for labs today.

## 2018-06-09 NOTE — Progress Notes (Addendum)
Subjective:   Patient ID: Michelle Davidson, female    DOB: 09-07-1945, 73 y.o.   MRN: [id number]  Michelle Davidson is a pleasant 73 y.o. year old female who presents to clinic today with Follow-up (Patient is here today to F/U from AWV this morning.  She agrees to Mammogram and BMD if order is placed [facility].  She is currently fasting.  Future orders are in system.)  on 06/09/2018  HPI: Health Maintenance  Topic Date Due  . DEXA SCAN  07/10/2009  . MAMMOGRAM  10/26/2017  . COLONOSCOPY  08/05/2020  . TETANUS/TDAP  12/16/2026  . INFLUENZA VACCINE  Completed  . Hepatitis C Screening  Completed  . PNA vac Low Risk Adult  Discontinued    Had AWV with Glenard Haring, RN this morning. Saw her GYN, Dr. Dellis Filbert on 02/02/18.  Note reviewed. Pap smear was done at that [place] [facility]. No PMB.  HTN- has been well controlled on current rxs- atenolol and HCTZ. No HA, blurred vision, CP or LE edema. No results found for: CREATININE  H/o HLD- due for labs today. No results found for: CHOL, HDL, LDLCALC, LDLDIRECT, TRIG, CHOLHDL  Migraines-  Uses zomig for abortive therapy.  Usually gets one -2 per year.  Diarrhea- Chronic issue.  Has been told it is IBS. Colonoscopy 07/2017.   Current Outpatient Medications on File Prior to Visit  Medication Sig Dispense Refill  . atenolol (TENORMIN) 25 MG tablet TAKE 1 TABLET BY MOUTH TWICE A DAY 180 tablet 1  . fluticasone (FLONASE) 50 MCG/ACT nasal spray PLACE 1 SPRAY INTO BOTH NOSTRILS DAILY. 16 g PRN  . hydrochlorothiazide (MICROZIDE) 12.5 MG capsule TAKE 1 CAPSULE BY MOUTH EVERY DAY 90 capsule 1  . Multiple Vitamins-Minerals (CENTRUM SILVER 50+WOMEN PO) Take 1 tablet by mouth daily.    Marland Kitchen zolmitriptan (ZOMIG-ZMT) 5 MG disintegrating tablet zolmitriptan 5 mg disintegrating tablet  DISSOLVE 1 TABLET ON TONGUE AND SWALLOW AT ONSET OF MIGRAINE MAY REPEAT ONCE AFTER 2HRS MAX 10MG /DAY 10 tablet 2   Current Facility-Administered Medications on File Prior to Visit    Medication Dose Route Frequency Provider Last Rate Last Dose  . 0.9 %  sodium chloride infusion  500 mL Intravenous Once Nandigam, Venia Minks, MD        Allergies  Allergen Reactions  . Esomeprazole Magnesium     Syncope  . Lipitor [Atorvastatin Calcium]     Muscle aches  . Zocor [Simvastatin]     Muscle aches  . Nexium [Esomeprazole Magnesium]   . Avelox [Moxifloxacin Hcl In Nacl] Rash    Past Medical History:  Diagnosis Date  . Arthritis   . Basal cell carcinoma   . Choroidal neovascularization    Right eye  . CNVM (choroidal neovascular membrane), right   . Fibromyalgia   . History of chicken pox   . History of colon polyps   . Hyperlipidemia   . Hypertension   . Migraines   . PAC (premature atrial contraction)   . PVC (premature ventricular contraction)     Past Surgical History:  Procedure Laterality Date  . [place] Right 2010  . BREAST BIOPSY Left 2011   Titanium chip implanted to mark the spot/benign findings  . EYE SURGERY Bilateral 03/28/2018   cataract removal with lens implants. Dr.Beavis  . TUBAL LIGATION  1978  . WISDOM TOOTH EXTRACTION      Family History  Problem Relation Age of Onset  . Arthritis Mother   . Hyperlipidemia  Mother   . Hypertension Mother   . Colon cancer Father   . Arthritis Maternal Grandmother   . Heart attack Maternal Grandfather   . Lung cancer Maternal Grandfather        Smoker  . Diabetes Paternal Grandmother   . Uterine cancer Paternal Grandmother   . Skin cancer Paternal Grandfather   . Diabetes Paternal Uncle   . Kidney cancer Paternal Uncle   . Stomach cancer Neg Hx   . Esophageal cancer Neg Hx     Social History   Socioeconomic History  . Marital status: Widowed    Spouse name: Not on file  . Number of children: 2  . Years of education: Not on file  . Highest education level: Not on file  Occupational History  . Occupation: retired  Scientific laboratory technician  . Financial resource strain: Not on file   . Food insecurity:    Worry: Not on file    Inability: Not on file  . Transportation needs:    Medical: Not on file    Non-medical: Not on file  Tobacco Use  . Smoking status: Former Smoker    Types: Cigarettes    Last attempt to quit: 1997    Years since quitting: 23.1  . Smokeless tobacco: Never Used  Substance and Sexual Activity  . Alcohol use: Yes    Comment: cocktail every night  . Drug use: No  . Sexual activity: Yes    Partners: Male    Comment: 1st intercourse- 31, partners- 1, widow  Lifestyle  . Physical activity:    Days per week: Not on file    Minutes per session: Not on file  . Stress: Not on file  Relationships  . Social connections:    Talks on phone: Not on file    Gets together: Not on file    Attends religious service: Not on file    Active member of club or organization: Not on file    Attends meetings of clubs or organizations: Not on file    Relationship status: Not on file  . Intimate partner violence:    Fear of current or ex partner: Not on file    Emotionally abused: Not on file    Physically abused: Not on file    Forced sexual activity: Not on file  Other Topics Concern  . Not on file  Social History Narrative  . Not on file   The PMH, PSH, Social History, Family History, Medications, and allergies have been reviewed in [facility], and have been updated if relevant.   Review of Systems  Constitutional: Negative.   HENT: Negative.   Eyes: Negative.   Respiratory: Negative.   Cardiovascular: Negative.   Gastrointestinal: Positive for diarrhea.  Endocrine: Negative.   Genitourinary: Negative.   Musculoskeletal: Negative.   Allergic/Immunologic: Negative.   Neurological: Negative.   Hematological: Negative.   Psychiatric/Behavioral: Negative.   All other systems reviewed and are negative.      Objective:    BP 130/64 (BP Location: Left Arm, Patient Position: Sitting, Cuff Size: Normal)   Pulse (!) 57   Temp 97.7 F (36.5 C)  (Oral)   Ht 5\' 3"  (1.6 m)   Wt 159 lb 9.6 oz (72.4 kg)   SpO2 94%   BMI 28.27 kg/m    Physical Exam Vitals signs and nursing note reviewed.  Constitutional:      Appearance: Normal appearance. She is normal weight.  HENT:     Head: Normocephalic and  atraumatic.     Right Ear: Tympanic membrane normal.     Left Ear: Tympanic membrane normal.     Nose: Nose normal.     Mouth/Throat:     Mouth: Mucous membranes are moist.  Eyes:     Extraocular Movements: Extraocular movements intact.  Neck:     Musculoskeletal: Normal range of motion.  Cardiovascular:     Rate and Rhythm: Normal rate and regular rhythm.     Pulses: Normal pulses.     Heart sounds: Normal heart sounds.  Pulmonary:     Effort: Pulmonary effort is normal.     Breath sounds: Normal breath sounds.  Musculoskeletal: Normal range of motion.     Right lower leg: No edema.     Left lower leg: No edema.  Skin:    General: Skin is warm and dry.  Neurological:     General: No focal deficit present.     Mental Status: She is alert and oriented to person, place, and time.  Psychiatric:        Mood and Affect: Mood normal.        Behavior: Behavior normal.        Thought Content: Thought content normal.        Judgment: Judgment normal.           Assessment & Plan:   Essential hypertension  Postmenopausal estrogen deficiency  Breast cancer screening  Screening for breast cancer - Plan: MM Digital Screening  Hyperlipidemia, unspecified hyperlipidemia type  Chronic migraine without aura without status migrainosus, not intractable No follow-ups on file.

## 2018-06-09 NOTE — Patient Instructions (Signed)
Please schedule your next medicare wellness visit with me in 1 yr.  Continue to eat heart healthy diet (full of fruits, vegetables, whole grains, lean protein, water--limit salt, fat, and sugar intake) and increase physical activity as tolerated.  Continue doing brain stimulating activities   Bring a copy of your living will and/or healthcare power of attorney to your next office visit.  I have ordered your mammogram and bone density. Please schedule.   Michelle Davidson , Thank you for taking time to come for your Medicare Wellness Visit. I appreciate your ongoing commitment to your health goals. Please review the following plan we discussed and let me know if I can assist you in the future.   These are the goals we discussed: Goals    . Increase physical activity       This is a list of the screening recommended for you and due dates:  Health Maintenance  Topic Date Due  . DEXA scan (bone density measurement)  07/10/2009  . Mammogram  10/26/2017  . Colon Cancer Screening  08/05/2020  . Tetanus Vaccine  12/16/2026  . Flu Shot  Completed  .  Hepatitis C: One time screening is recommended by Center for Disease Control  (CDC) for  adults born from [birth year] through 1966.   Completed  . Pneumonia vaccines  Discontinued    Health Maintenance After Age 37 After age 72, you are at a higher risk for certain long-term diseases and infections as well as injuries from falls. Falls are a major cause of broken bones and head injuries in people who are older than age 55. Getting regular preventive care can help to keep you healthy and well. Preventive care includes getting regular testing and making lifestyle changes as recommended by your health care provider. Talk with your health care provider about:  Which screenings and tests you should have. A screening is a test that checks for a disease when you have no symptoms.  A diet and exercise plan that is right for you. What should I know about  screenings and tests to prevent falls? Screening and testing are the best ways to find a health problem early. Early diagnosis and treatment give you the best chance of managing medical conditions that are common after age 44. Certain conditions and lifestyle choices may make you more likely to have a fall. Your health care provider may recommend:  Regular vision checks. Poor vision and conditions such as cataracts can make you more likely to have a fall. If you wear glasses, make sure to get your prescription updated if your vision changes.  Medicine review. Work with your health care provider to regularly review all of the medicines you are taking, including over-the-counter medicines. Ask your health care provider about any side effects that may make you more likely to have a fall. Tell your health care provider if any medicines that you take make you feel dizzy or sleepy.  Osteoporosis screening. Osteoporosis is a condition that causes the bones to get weaker. This can make the bones weak and cause them to break more easily.  Blood pressure screening. Blood pressure changes and medicines to control blood pressure can make you feel dizzy.  Strength and balance checks. Your health care provider may recommend certain tests to check your strength and balance while standing, walking, or changing positions.  Foot health exam. Foot pain and numbness, as well as not wearing proper footwear, can make you more likely to have a fall.  Depression  screening. You may be more likely to have a fall if you have a fear of falling, feel emotionally low, or feel unable to do activities that you used to do.  Alcohol use screening. Using too much alcohol can affect your balance and may make you more likely to have a fall. What actions can I take to lower my risk of falls? General instructions  Talk with your health care provider about your risks for falling. Tell your health care provider if: ? You fall. Be sure  to tell your health care provider about all falls, even ones that seem minor. ? You feel dizzy, sleepy, or off-balance.  Take over-the-counter and prescription medicines only as told by your health care provider. These include any supplements.  Eat a healthy diet and maintain a healthy weight. A healthy diet includes low-fat dairy products, low-fat (lean) meats, and fiber from whole grains, beans, and lots of fruits and vegetables. Home safety  Remove any tripping hazards, such as rugs, cords, and clutter.  Install safety equipment such as grab bars in bathrooms and safety rails on stairs.  Keep rooms and walkways well-lit. Activity   Follow a regular exercise program to stay fit. This will help you maintain your balance. Ask your health care provider what types of exercise are appropriate for you.  If you need a cane or walker, use it as recommended by your health care provider.  Wear supportive shoes that have nonskid soles. Lifestyle  Do not drink alcohol if your health care provider tells you not to drink.  If you drink alcohol, limit how much you have: ? 0-1 drink a day for women. ? 0-2 drinks a day for men.  Be aware of how much alcohol is in your drink. In the [place] one drink equals one typical bottle of beer (12 oz), one-half glass of wine (5 oz), or one shot of hard liquor (1 oz).  Do not use any products that contain nicotine or tobacco, such as cigarettes and e-cigarettes. If you need help quitting, ask your health care provider. Summary  Having a healthy lifestyle and getting preventive care can help to protect your health and wellness after age 49.  Screening and testing are the best way to find a health problem early and help you avoid having a fall. Early diagnosis and treatment give you the best chance for managing medical conditions that are more common for people who are older than age 65.  Falls are a major cause of broken bones and head injuries in people  who are older than age 66. Take precautions to prevent a fall at home.  Work with your health care provider to learn what changes you can make to improve your health and wellness and to prevent falls. This information is not intended to replace advice given to you by your health care provider. Make sure you discuss any questions you have with your health care provider. Document Released: 02/25/2017 Document Revised: 02/25/2017 Document Reviewed: 02/25/2017 Elsevier Interactive Patient Education  2019 [employer].

## 2018-06-09 NOTE — Patient Instructions (Addendum)
Great to see you. I will call you with your lab results from today and you can view them online.   Please call the breast center at [phone] to schedule your mammogram and bone density.  Try immodium for diarrhea, add probiotic like Align.

## 2018-06-09 NOTE — Assessment & Plan Note (Signed)
Has been under good control with zomig.

## 2018-06-09 NOTE — Progress Notes (Signed)
I reviewed health advisor's note, was available for consultation, and agree with documentation and plan.  

## 2018-06-09 NOTE — Assessment & Plan Note (Signed)
Well controlled. No changes made. Due for labs today. Orders Placed This Encounter  Procedures  . MM Digital Screening

## 2018-06-09 NOTE — Addendum Note (Signed)
Addended by: Lynnea Ferrier on: 06/09/2018 10:08 AM   Modules accepted: Orders

## 2018-06-09 NOTE — Assessment & Plan Note (Addendum)
Not currently anything for it. I advised OTC immodium and probiotic.

## 2018-06-10 ENCOUNTER — Telehealth: Payer: Self-pay | Admitting: Family Medicine

## 2018-06-10 ENCOUNTER — Other Ambulatory Visit: Payer: Self-pay | Admitting: Family Medicine

## 2018-06-10 DIAGNOSIS — Z1239 Encounter for other screening for malignant neoplasm of breast: Secondary | ICD-10-CM

## 2018-06-10 NOTE — Telephone Encounter (Signed)
Routing to [employer]

## 2018-06-10 NOTE — Telephone Encounter (Signed)
Yes okay to change to zomig, send in rx as requested and update med list.  Thank you for all you do!

## 2018-06-10 NOTE — Telephone Encounter (Signed)
Copied from [facility] [id number]. Topic: Quick Communication - Rx Refill/Question >> Jun 10, 2018 10:15 AM Virl Axe D wrote: Medication: zolmitriptan (ZOMIG-ZMT) 5 MG disintegrating tablet / Pt called and stated she does not want the alternative rizatriptan (MAXALT-MLT) 10 MG disintegrating tablet that was sent in yesterday. Requesting new rx for zolmitriptan (ZOMIG-ZMT) 5 MG disintegrating tablet. She also stated there was no quantity or directions on rx. Please advise.   Has the patient contacted their pharmacy? Yes.   (Agent: If no, request that the patient contact the pharmacy for the refill.) (Agent: If yes, when and what did the pharmacy advise?)  Preferred Pharmacy (with phone number or street name): [id number] - [place], [place] [address] [phone] (Phone) [phone] (Fax)  Agent: Please be advised that RX refills may take up to 3 business days. We ask that you follow-up with your pharmacy.

## 2018-06-10 NOTE — Telephone Encounter (Signed)
TA-Pt called requesting Zomig 5mg  disolving tab instead of the Maxalt/plz advise if this is ok to send in/thx dmf

## 2018-06-11 ENCOUNTER — Other Ambulatory Visit: Payer: Self-pay

## 2018-06-11 ENCOUNTER — Encounter: Payer: Self-pay | Admitting: Family Medicine

## 2018-06-11 MED ORDER — RIZATRIPTAN BENZOATE 10 MG PO TBDP: ORAL_TABLET | ORAL | 5 refills | Status: DC

## 2018-06-15 ENCOUNTER — Encounter: Payer: Self-pay | Admitting: Family Medicine

## 2018-06-16 ENCOUNTER — Other Ambulatory Visit: Payer: Self-pay

## 2018-06-16 MED ORDER — ZOLMITRIPTAN 5 MG PO TBDP: ORAL_TABLET | ORAL | 2 refills | Status: DC

## 2018-07-07 ENCOUNTER — Ambulatory Visit: Payer: Medicare Other

## 2018-07-23 ENCOUNTER — Ambulatory Visit: Payer: Medicare Other

## 2018-08-25 ENCOUNTER — Ambulatory Visit: Payer: Medicare Other

## 2018-10-11 ENCOUNTER — Other Ambulatory Visit: Payer: Self-pay | Admitting: Family Medicine

## 2018-10-13 ENCOUNTER — Ambulatory Visit
Admission: RE | Admit: 2018-10-13 | Discharge: 2018-10-13 | Disposition: A | Discharge status: Home / Self Care | Payer: Medicare Other | Source: Ambulatory Visit | Attending: Family Medicine | Admitting: Family Medicine

## 2018-10-13 ENCOUNTER — Other Ambulatory Visit: Payer: Self-pay

## 2018-10-13 DIAGNOSIS — Z1231 Encounter for screening mammogram for malignant neoplasm of breast: Secondary | ICD-10-CM | POA: Diagnosis not present

## 2018-10-13 DIAGNOSIS — Z1239 Encounter for other screening for malignant neoplasm of breast: Secondary | ICD-10-CM

## 2018-11-15 ENCOUNTER — Other Ambulatory Visit: Payer: Self-pay | Admitting: Family Medicine

## 2018-11-23 DIAGNOSIS — Z961 Presence of intraocular lens: Secondary | ICD-10-CM | POA: Diagnosis not present

## 2018-11-23 DIAGNOSIS — H43822 Vitreomacular adhesion, left eye: Secondary | ICD-10-CM | POA: Diagnosis not present

## 2018-11-23 DIAGNOSIS — H04123 Dry eye syndrome of bilateral lacrimal glands: Secondary | ICD-10-CM | POA: Diagnosis not present

## 2018-11-23 DIAGNOSIS — H18413 Arcus senilis, bilateral: Secondary | ICD-10-CM | POA: Diagnosis not present

## 2018-11-25 ENCOUNTER — Other Ambulatory Visit: Payer: Self-pay

## 2018-11-25 ENCOUNTER — Encounter: Payer: Self-pay | Admitting: Family Medicine

## 2018-11-25 MED ORDER — FLUTICASONE PROPIONATE 50 MCG/ACT NA SUSP: 1.0000 | Freq: Every day | NASAL | 11 refills | Status: DC

## 2018-12-02 ENCOUNTER — Encounter: Payer: Self-pay | Admitting: Family Medicine

## 2018-12-03 ENCOUNTER — Encounter: Payer: Self-pay | Admitting: Family Medicine

## 2018-12-03 ENCOUNTER — Ambulatory Visit (INDEPENDENT_AMBULATORY_CARE_PROVIDER_SITE_OTHER): Payer: Medicare Other | Admitting: Family Medicine

## 2018-12-03 DIAGNOSIS — J302 Other seasonal allergic rhinitis: Secondary | ICD-10-CM

## 2018-12-03 NOTE — Progress Notes (Signed)
Virtual Visit via Video Note  I connected with Michelle Davidson on 12/03/18 at  2:30 PM EDT by a video enabled telemedicine application and verified that I am speaking with the correct person using two identifiers. Location patient: home Location provider: work  Persons participating in the virtual visit: patient, provider  I discussed the limitations of evaluation and management by telemedicine and the availability of in person appointments. The patient expressed understanding and agreed to proceed.  Chief Complaint  Patient presents with  . Cough    dry cough/ yellow greenish mucous when blowing nose/ when waking up eyes get crusty sometimes/ wakingup with sore throat/ hoarse/ pt [place] of upper left mouth pain/floanse, alleve/ couple months     HPI: Michelle Davidson is a 73 y.o. female complains of about 2 mo h/o dry cough, nasal congestion, runny nose sore/scratchy throat in AM. She also notes some crusting of eyes when she first wakes up in AM but this is intermittent. Hoarse voice today. Symptoms are worse in the AM. She does feel like she is constantly blowing her nose.  No fever, chills. No body aches. No SOB, DOE, CP.  She has tried taking flonase and aleve.  She has a h/o seasonal allergies but is not taking anti-histamine.   Past Medical History:  Diagnosis Date  . Arthritis   . Basal cell carcinoma   . Choroidal neovascularization    Right eye  . CNVM (choroidal neovascular membrane), right   . Fibromyalgia   . History of chicken pox   . History of colon polyps   . Hyperlipidemia   . Hypertension   . Migraines   . PAC (premature atrial contraction)   . PVC (premature ventricular contraction)     Past Surgical History:  Procedure Laterality Date  . [place] Right 2010  . BREAST BIOPSY Left 2011   Titanium chip implanted to mark the spot/benign findings  . EYE SURGERY Bilateral 03/28/2018   cataract removal with lens implants. Dr.Beavis  . TUBAL  LIGATION  1978  . WISDOM TOOTH EXTRACTION      Family History  Problem Relation Age of Onset  . Arthritis Mother   . Hyperlipidemia Mother   . Hypertension Mother   . Colon cancer Father   . Arthritis Maternal Grandmother   . Heart attack Maternal Grandfather   . Lung cancer Maternal Grandfather        Smoker  . Diabetes Paternal Grandmother   . Uterine cancer Paternal Grandmother   . Skin cancer Paternal Grandfather   . Diabetes Paternal Uncle   . Kidney cancer Paternal Uncle   . Breast cancer Cousin   . Stomach cancer Neg Hx   . Esophageal cancer Neg Hx     Social History   Tobacco Use  . Smoking status: Former Smoker    Types: Cigarettes    Quit date: 1997    Years since quitting: 23.6  . Smokeless tobacco: Never Used  Substance Use Topics  . Alcohol use: Yes    Comment: cocktail every night  . Drug use: No     Current Outpatient Medications:  .  atenolol (TENORMIN) 25 MG tablet, TAKE 1 TABLET BY MOUTH TWICE A DAY, Disp: 180 tablet, Rfl: 1 .  fluticasone (FLONASE) 50 MCG/ACT nasal spray, Place 1 spray into both nostrils daily., Disp: 16 mL, Rfl: 11 .  hydrochlorothiazide (MICROZIDE) 12.5 MG capsule, TAKE 1 CAPSULE BY MOUTH EVERY DAY, Disp: 90 capsule, Rfl: 1 .  Multiple  Vitamins-Minerals (CENTRUM SILVER 50+WOMEN PO), Take 1 tablet by mouth daily., Disp: , Rfl:  .  rizatriptan (MAXALT-MLT) 10 MG disintegrating tablet, UAD at onset of headache, Disp: 10 tablet, Rfl: 5 .  zolmitriptan (ZOMIG-ZMT) 5 MG disintegrating tablet, UAD SL at onset of migraine; may repeat after 2h, Disp: 10 tablet, Rfl: 2  Allergies  Allergen Reactions  . Esomeprazole Magnesium     Syncope  . Lipitor [Atorvastatin Calcium]     Muscle aches  . Zocor [Simvastatin]     Muscle aches  . Nexium [Esomeprazole Magnesium]   . Avelox [Moxifloxacin Hcl In Nacl] Rash      ROS: See pertinent positives and negatives per HPI.   EXAM:  VITALS per patient if applicable: There were no vitals  taken for this visit.   GENERAL: alert, oriented, appears well and in no acute distress  HEENT: atraumatic, conjunctiva clear, no obvious abnormalities on inspection of external nose and ears  NECK: normal movements of the head and neck  LUNGS: on inspection no signs of respiratory distress, breathing rate appears normal, no obvious gross SOB, gasping or wheezing, no conversational dyspnea  CV: no obvious cyanosis  MS: moves all visible extremities without noticeable abnormality  PSYCH/NEURO: pleasant and cooperative, no obvious depression or anxiety, speech and thought processing grossly intact   ASSESSMENT AND PLAN:  1. Seasonal allergies - cont flonase - add claritin, zyrtec, or allegra 1 tab daily - add nasal saline spray 3x/day - mucinex BID x 5 days - f/u in 2-3 wks if no/minimal improvement or sooner PRN   I discussed the assessment and treatment plan with the patient. The patient was provided an opportunity to ask questions and all were answered. The patient agreed with the plan and demonstrated an understanding of the instructions.   The patient was advised to call back or seek an in-person evaluation if the symptoms worsen or if the condition fails to improve as anticipated.   [name], DO

## 2018-12-06 ENCOUNTER — Encounter: Payer: Self-pay | Admitting: Family Medicine

## 2018-12-08 ENCOUNTER — Telehealth: Payer: Self-pay

## 2018-12-08 ENCOUNTER — Encounter: Payer: Self-pay | Admitting: Family Medicine

## 2018-12-08 NOTE — Telephone Encounter (Signed)
Copied from [facility] [id number]. Topic: General - Other >> Dec 07, 2018  3:17 PM Yvette Rack wrote: Reason for CRM: Pt stated she spoke with Sharyn Lull on yesterday and she was told that she would receive a call back but she has not heard from anyone. Pt stated she is experiencing a migraine and she just took another dose of her migraine medication. Pt requests call back.

## 2018-12-13 NOTE — Telephone Encounter (Signed)
Completed/thx dmf 

## 2018-12-14 ENCOUNTER — Ambulatory Visit: Payer: Medicare Other | Admitting: Family Medicine

## 2019-01-04 DIAGNOSIS — Z23 Encounter for immunization: Secondary | ICD-10-CM | POA: Diagnosis not present

## 2019-01-10 ENCOUNTER — Encounter: Payer: Self-pay | Admitting: Family Medicine

## 2019-02-04 ENCOUNTER — Encounter: Payer: Medicare Other | Admitting: Obstetrics & Gynecology

## 2019-02-17 ENCOUNTER — Other Ambulatory Visit: Payer: Self-pay

## 2019-02-17 ENCOUNTER — Ambulatory Visit (INDEPENDENT_AMBULATORY_CARE_PROVIDER_SITE_OTHER): Payer: Medicare Other | Admitting: Gastroenterology

## 2019-02-17 ENCOUNTER — Encounter: Payer: Self-pay | Admitting: Gastroenterology

## 2019-02-17 ENCOUNTER — Other Ambulatory Visit (INDEPENDENT_AMBULATORY_CARE_PROVIDER_SITE_OTHER): Payer: Medicare Other

## 2019-02-17 VITALS — BP 140/70 | HR 64 | Temp 98.4°F | Ht 62.5 in | Wt 158.5 lb

## 2019-02-17 DIAGNOSIS — K580 Irritable bowel syndrome with diarrhea: Secondary | ICD-10-CM | POA: Diagnosis not present

## 2019-02-17 DIAGNOSIS — R197 Diarrhea, unspecified: Secondary | ICD-10-CM

## 2019-02-17 DIAGNOSIS — R140 Abdominal distension (gaseous): Secondary | ICD-10-CM | POA: Diagnosis not present

## 2019-02-17 DIAGNOSIS — R1084 Generalized abdominal pain: Secondary | ICD-10-CM

## 2019-02-17 LAB — BASIC METABOLIC PANEL
BUN: 14 mg/dL (ref 6–23)
CO2: 31 mEq/L (ref 19–32)
Calcium: 9.6 mg/dL (ref 8.4–10.5)
Chloride: 97 mEq/L (ref 96–112)
Creatinine, Ser: 0.73 mg/dL (ref 0.40–1.20)
GFR: 77.8 mL/min (ref >60.00)
Glucose, Bld: 93 mg/dL (ref 70–99)
Potassium: 4.1 mEq/L (ref 3.5–5.1)
Sodium: 136 mEq/L (ref 135–145)

## 2019-02-17 LAB — IGA: IgA: 182 mg/dL (ref 68–378)

## 2019-02-17 MED ORDER — HYOSCYAMINE SULFATE 0.125 MG SL SUBL: 0.1250 mg | SUBLINGUAL_TABLET | SUBLINGUAL | 0 refills | Status: DC | PRN

## 2019-02-17 MED ORDER — CHOLESTYRAMINE 4 G PO PACK: 4.0000 g | PACK | Freq: Every day | ORAL | 1 refills | Status: DC

## 2019-02-17 NOTE — Patient Instructions (Signed)
We have sent the following medications to your pharmacy for you to pick up at your convenience: Levsin 0.125 mg   Questran one packet daily  Your provider has requested that you go to the basement level for lab work before leaving today. Press "B" on the elevator. The lab is located at the first door on the left as you exit the elevator.   You have been scheduled for a CT scan of the abdomen and pelvis at [facility] are scheduled on 02/23/2019 at 10:30 am. You should arrive 15 minutes prior to your appointment time for registration. Please follow the written instructions below on the day of your exam:  WARNING: IF YOU ARE ALLERGIC TO IODINE/X-RAY DYE, PLEASE NOTIFY RADIOLOGY IMMEDIATELY AT [phone]! YOU WILL BE GIVEN A 13 HOUR PREMEDICATION PREP.  1) Do not eat or drink anything after 6:30 am (4 hours prior to your test) 2) You have been given 2 bottles of oral contrast to drink. The solution may taste better if refrigerated, but do NOT add ice or any other liquid to this solution. Shake well before drinking.    Drink 1 bottle of contrast @ 8:30 am (2 hours prior to your exam)  Drink 1 bottle of contrast @ 9:30 am (1 hour prior to your exam)  You may take any medications as prescribed with a small amount of water, if necessary. If you take any of the following medications: METFORMIN, GLUCOPHAGE, GLUCOVANCE, AVANDAMET, RIOMET, FORTAMET, [place] MET, JANUMET, GLUMETZA or METAGLIP, you MAY be asked to HOLD this medication 48 hours AFTER the exam.  The purpose of you drinking the oral contrast is to aid in the visualization of your intestinal tract. The contrast solution may cause some diarrhea. Depending on your individual set of symptoms, you may also receive an intravenous injection of x-ray contrast/dye. Plan on being at [facility] for 30 minutes or longer, depending on the type of exam you are having performed.  This test typically takes 30-45 minutes to  complete.  If you have any questions regarding your exam or if you need to reschedule, you may call the CT department at [phone] between the hours of 8:00 am and 5:00 pm, Monday-Friday.  ________________________________________________________________________

## 2019-02-17 NOTE — Progress Notes (Signed)
02/17/2019 Michelle Davidson [id number] Mar 14, 1946   HISTORY OF PRESENT ILLNESS: This is a 73 year old female who is a patient of Dr. Woodward Ku.  She has a diagnosis of irritable bowel syndrome with predominant diarrhea.  Her last colonoscopy was in April 2019 at which time she was found to have some polyps that were removed and were tubular adenomas on pathology, severe diverticulosis with some associated narrowing and diverticular spasm at the time as well as internal hemorrhoids.  She presents here today with complaints of episodes of lower abdominal pain/burning, diarrhea, bloating/distended feeling in her abdomen.  She tells me that overall her life is controlled by her bowels.  She says she cannot plan on doing early morning events or appointments unless she gets up very early in order to have several bowel movements multiple times in the morning.  She says that oftentimes there is a lot of urgency and no warning with her bowel habits.  She has very little time to get to the bathroom.  Sometimes if she is out in public she does not think she can make it to the bathroom in time.  She says she literally lives in fear of what would happen if she leaves her house and she has not had a bowel movement.  She just wants answers and more so relief of her symptoms.  She says that her bowel movements occasionally are normal, but more so described as "blobs".  She describes burning across her lower abdomen to severe pain with bowel movements at times.  She says that sometimes with bowel movement she has nausea, sweats, chills, dizziness.  She does not have pain daily.  Sometimes symptoms occur immediately after eating and continue for several hours.  Cannot identify certain foods that seem to induce her symptoms.  She had been treated with Librax in the past and remembers dating back to the 1970s that she has had problems with her bowels.  She says that things seemed to calm down for a while, but then after  several years she says they "came back with a vengeance".  She has had 2 major episodes of these severe episodes of excruciating pain with associated dizziness, sweats, chills.  These episodes occurred in February and August.  She says that she is tried powder fiber supplements and had taken Imodium regularly for a while.  Past Medical History:  Diagnosis Date  . Arthritis   . Basal cell carcinoma   . Choroidal neovascularization    Right eye  . CNVM (choroidal neovascular membrane), right   . Fibromyalgia   . History of chicken pox   . History of colon polyps   . Hyperlipidemia   . Hypertension   . Migraines   . PAC (premature atrial contraction)   . PVC (premature ventricular contraction)    Past Surgical History:  Procedure Laterality Date  . [place] Right 2010  . BREAST BIOPSY Left 2011   Titanium chip implanted to mark the spot/benign findings  . EYE SURGERY Bilateral 03/28/2018   cataract removal with lens implants. Dr.Beavis  . TUBAL LIGATION  1978  . WISDOM TOOTH EXTRACTION      reports that she quit smoking about 23 years ago. Her smoking use included cigarettes. She has never used smokeless tobacco. She reports current alcohol use. She reports that she does not use drugs. family history includes Arthritis in her maternal grandmother and mother; Breast cancer in her cousin; Colon cancer in her father and sister; Diabetes  in her paternal grandmother and paternal uncle; Heart attack in her maternal grandfather; Hyperlipidemia in her mother; Hypertension in her mother; Kidney cancer in her paternal uncle; Lung cancer in her maternal grandfather; Skin cancer in her paternal grandfather; Uterine cancer in her paternal grandmother. Allergies  Allergen Reactions  . Esomeprazole Magnesium     Syncope  . Lipitor [Atorvastatin Calcium]     Muscle aches  . Zocor [Simvastatin]     Muscle aches  . Nexium [Esomeprazole Magnesium]   . Avelox [Moxifloxacin Hcl In Nacl]  Rash      Outpatient Encounter Medications as of 02/17/2019  Medication Sig  . atenolol (TENORMIN) 25 MG tablet TAKE 1 TABLET BY MOUTH TWICE A DAY  . fluticasone (FLONASE) 50 MCG/ACT nasal spray Place 1 spray into both nostrils daily.  . hydrochlorothiazide (MICROZIDE) 12.5 MG capsule TAKE 1 CAPSULE BY MOUTH EVERY DAY  . Multiple Vitamins-Minerals (CENTRUM SILVER 50+WOMEN PO) Take 1 tablet by mouth daily.  Marland Kitchen zolmitriptan (ZOMIG-ZMT) 5 MG disintegrating tablet UAD SL at onset of migraine; may repeat after 2h  . [DISCONTINUED] rizatriptan (MAXALT-MLT) 10 MG disintegrating tablet UAD at onset of headache   No facility-administered encounter medications on file as of 02/17/2019.      REVIEW OF SYSTEMS  : All other systems reviewed and negative except where noted in the History of Present Illness.   PHYSICAL EXAM: BP 140/70 (BP Location: Left Arm, Patient Position: Sitting, Cuff Size: Normal)   Pulse 64   Temp 98.4 F (36.9 C)   Ht 5' 2.5" (1.588 m)   Wt 158 lb 8 oz (71.9 kg)   BMI 28.53 kg/m  General: Well developed white female in no acute distress Head: Normocephalic and atraumatic Eyes:  Sclerae anicteric, conjunctiva pink. Ears: Normal auditory acuity Lungs: Clear throughout to auscultation; no increased WOB. Heart: Regular rate and rhythm; no M/R/G. Abdomen: Soft, non-distended.  BS present.  Mild diffuse TTP. Musculoskeletal: Symmetrical with no gross deformities  Skin: No lesions on visible extremities Extremities: No edema  Neurological: Alert oriented x 4, grossly non-focal Psychological:  Alert and cooperative. Normal mood and affect  ASSESSMENT AND PLAN: *73 year old female with history of IBS-D here with complaints of diarrhea, generalized abdominal pain, abdominal bloating/distended feeling.  I think that this is all likely due to her irritable bowel syndrome.  Nonetheless we will evaluate with a CT scan of the abdomen pelvis with contrast.  We will perform celiac  labs to rule that out as well.  I am going to start her on Questran 1 packet daily and give her prescription for Levsin to use as needed as well.  She will follow-up with Dr. Silverio Decamp in about 4 to 6 weeks and we will call her with results of her labs and CT scan in the interim.  ? If Viberzi would be an option for her.   CC:  Lucille Passy, MD

## 2019-02-18 LAB — TISSUE TRANSGLUTAMINASE, IGA: (tTG) Ab, IgA: 1 U/mL

## 2019-02-23 ENCOUNTER — Ambulatory Visit (HOSPITAL_COMMUNITY): Payer: Medicare Other

## 2019-02-23 ENCOUNTER — Encounter: Payer: Medicare Other | Admitting: Obstetrics & Gynecology

## 2019-02-23 ENCOUNTER — Encounter (HOSPITAL_COMMUNITY): Payer: Self-pay

## 2019-03-02 ENCOUNTER — Other Ambulatory Visit: Payer: Self-pay

## 2019-03-02 ENCOUNTER — Ambulatory Visit (HOSPITAL_COMMUNITY)
Admission: RE | Admit: 2019-03-02 | Discharge: 2019-03-02 | Disposition: A | Discharge status: Home / Self Care | Payer: Medicare Other | Source: Ambulatory Visit | Attending: Gastroenterology | Admitting: Gastroenterology

## 2019-03-02 DIAGNOSIS — R140 Abdominal distension (gaseous): Secondary | ICD-10-CM | POA: Diagnosis not present

## 2019-03-02 DIAGNOSIS — K5730 Diverticulosis of large intestine without perforation or abscess without bleeding: Secondary | ICD-10-CM | POA: Diagnosis not present

## 2019-03-02 DIAGNOSIS — D259 Leiomyoma of uterus, unspecified: Secondary | ICD-10-CM | POA: Diagnosis not present

## 2019-03-02 MED ORDER — SODIUM CHLORIDE (PF) 0.9 % IJ SOLN
INTRAMUSCULAR | Status: AC
  Filled 2019-03-02: qty 50

## 2019-03-02 MED ORDER — IOHEXOL 300 MG/ML  SOLN
100.0000 mL | Freq: Once | INTRAMUSCULAR | Status: AC | PRN
  Administered 2019-03-02: 16:00:00 100 mL via INTRAVENOUS

## 2019-03-04 ENCOUNTER — Encounter: Payer: Self-pay | Admitting: Gastroenterology

## 2019-03-04 DIAGNOSIS — R140 Abdominal distension (gaseous): Secondary | ICD-10-CM | POA: Insufficient documentation

## 2019-03-04 DIAGNOSIS — R1084 Generalized abdominal pain: Secondary | ICD-10-CM | POA: Insufficient documentation

## 2019-03-11 ENCOUNTER — Other Ambulatory Visit: Payer: Self-pay | Admitting: Gastroenterology

## 2019-03-28 NOTE — Progress Notes (Signed)
Reviewed and agree with documentation and assessment and plan. K. Veena Nandigam , MD   

## 2019-04-01 ENCOUNTER — Telehealth: Payer: Self-pay | Admitting: Gastroenterology

## 2019-04-01 NOTE — Telephone Encounter (Signed)
Spoke with the patient. Last seen by Alonza Bogus, PA for IBS/diarrhea. Normal CT scan. Normal labs. Offered Levsin which she uses and Colestipol which she does not use. She does not want to "cover up symptoms." She does not want to come in to be seen due to [place]. "Just another doctor office." She wants to try eliminating milk products from her diet to see if this helps. She is canceling her appointment for now and understands she can call back to reschedule if she decides to pursue this issue.

## 2019-04-04 ENCOUNTER — Ambulatory Visit: Payer: Medicare Other | Admitting: Gastroenterology

## 2019-04-13 ENCOUNTER — Encounter: Payer: Self-pay | Admitting: Family Medicine

## 2019-05-02 ENCOUNTER — Encounter: Payer: Medicare Other | Admitting: Obstetrics & Gynecology

## 2019-05-05 ENCOUNTER — Other Ambulatory Visit: Payer: Self-pay

## 2019-05-05 MED ORDER — HYDROCHLOROTHIAZIDE 12.5 MG PO CAPS: ORAL_CAPSULE | ORAL | 0 refills | Status: DC

## 2019-05-05 MED ORDER — ATENOLOL 25 MG PO TABS: 25.0000 mg | ORAL_TABLET | Freq: Two times a day (BID) | ORAL | 1 refills | Status: DC

## 2019-05-05 NOTE — Telephone Encounter (Signed)
Last ov 06/09/18 Last fill for [facility] 10/11/18  #90/1 Last fill for Atenolol 10/11/18 #180/1

## 2019-06-10 ENCOUNTER — Ambulatory Visit: Payer: Medicare Other | Attending: Internal Medicine

## 2019-06-10 DIAGNOSIS — Z23 Encounter for immunization: Secondary | ICD-10-CM | POA: Insufficient documentation

## 2019-06-10 NOTE — Progress Notes (Signed)
   Covid-19 Vaccination Clinic  Name:  Michelle Davidson    MRN: [id number] DOB: 1945/07/25  06/10/2019  Michelle Davidson was observed post Covid-19 immunization for 15 minutes without incidence. She was provided with Vaccine Information Sheet and instruction to access the V-Safe system.   Michelle Davidson was instructed to call 911 with any severe reactions post vaccine: Marland Kitchen Difficulty breathing  . Swelling of your face and throat  . A fast heartbeat  . A bad rash all over your body  . Dizziness and weakness    Immunizations Administered    Name Date Dose VIS Date Route   Pfizer COVID-19 Vaccine 06/10/2019  4:49 PM 0.3 mL 04/08/2019 Intramuscular   Manufacturer: [place]   Lot: [id number]   [place]: [id number]

## 2019-06-15 DIAGNOSIS — H43393 Other vitreous opacities, bilateral: Secondary | ICD-10-CM | POA: Diagnosis not present

## 2019-06-15 DIAGNOSIS — H43823 Vitreomacular adhesion, bilateral: Secondary | ICD-10-CM | POA: Diagnosis not present

## 2019-06-15 DIAGNOSIS — H4423 Degenerative myopia, bilateral: Secondary | ICD-10-CM | POA: Diagnosis not present

## 2019-06-15 DIAGNOSIS — H43811 Vitreous degeneration, right eye: Secondary | ICD-10-CM | POA: Diagnosis not present

## 2019-06-19 ENCOUNTER — Encounter: Payer: Self-pay | Admitting: Family Medicine

## 2019-07-03 ENCOUNTER — Ambulatory Visit: Payer: Medicare Other | Attending: Internal Medicine

## 2019-07-03 DIAGNOSIS — Z23 Encounter for immunization: Secondary | ICD-10-CM | POA: Insufficient documentation

## 2019-07-03 NOTE — Progress Notes (Signed)
   Covid-19 Vaccination Clinic  Name:  AL NOURY    MRN: [id number] DOB: [birth year]  07/03/2019  Ms. Bialas was observed post Covid-19 immunization for 15 minutes without incident. She was provided with Vaccine Information Sheet and instruction to access the V-Safe system.   Ms. Vanhorne was instructed to call 911 with any severe reactions post vaccine: Marland Kitchen Difficulty breathing  . Swelling of face and throat  . A fast heartbeat  . A bad rash all over body  . Dizziness and weakness   Immunizations Administered    Name Date Dose VIS Date Route   Pfizer COVID-19 Vaccine 07/03/2019  8:36 AM 0.3 mL 04/08/2019 Intramuscular   Manufacturer: [place]   Lot: [id number]   [place]: [id number]

## 2019-07-15 ENCOUNTER — Other Ambulatory Visit: Payer: Self-pay

## 2019-07-18 ENCOUNTER — Encounter: Payer: Self-pay | Admitting: Obstetrics & Gynecology

## 2019-07-18 ENCOUNTER — Other Ambulatory Visit: Payer: Self-pay

## 2019-07-18 ENCOUNTER — Ambulatory Visit (INDEPENDENT_AMBULATORY_CARE_PROVIDER_SITE_OTHER): Payer: Medicare Other | Admitting: Obstetrics & Gynecology

## 2019-07-18 VITALS — BP 136/88 | Ht 62.5 in | Wt 158.6 lb

## 2019-07-18 DIAGNOSIS — E663 Overweight: Secondary | ICD-10-CM

## 2019-07-18 DIAGNOSIS — Z01419 Encounter for gynecological examination (general) (routine) without abnormal findings: Secondary | ICD-10-CM | POA: Diagnosis not present

## 2019-07-18 DIAGNOSIS — Z13820 Encounter for screening for osteoporosis: Secondary | ICD-10-CM

## 2019-07-18 DIAGNOSIS — Z780 Asymptomatic menopausal state: Secondary | ICD-10-CM

## 2019-07-18 NOTE — Progress Notes (Signed)
Michelle Davidson [birth year] [id number]   History:    74 y.o. G2P2L2 Widowed  RP:  Established patient presenting for annual gyn exam   HPI: Menopause, well on no HRT.  No postmenopausal bleeding.  No pelvic pain.  Abstinent.  Urine and bowel movements normal.  Breasts normal.  Body mass index 28.55.  Needs to increase fitness activities.  Health labs with Fam MD.  Past medical history,surgical history, family history and social history were all reviewed and documented in the EPIC chart.  Gynecologic History No LMP recorded. Patient is postmenopausal.  Obstetric History OB History  Gravida Para Term Preterm AB Living  2 2       2   SAB TAB Ectopic Multiple Live Births               # Outcome Date GA Lbr Len/2nd Weight Sex Delivery Anes PTL Lv  2 Para           1 Para              ROS: A ROS was performed and pertinent positives and negatives are included in the history.  GENERAL: No fevers or chills. HEENT: No change in vision, no earache, sore throat or sinus congestion. NECK: No pain or stiffness. CARDIOVASCULAR: No chest pain or pressure. No palpitations. PULMONARY: No shortness of breath, cough or wheeze. GASTROINTESTINAL: No abdominal pain, nausea, vomiting or diarrhea, melena or bright red blood per rectum. GENITOURINARY: No urinary frequency, urgency, hesitancy or dysuria. MUSCULOSKELETAL: No joint or muscle pain, no back pain, no recent trauma. DERMATOLOGIC: No rash, no itching, no lesions. ENDOCRINE: No polyuria, polydipsia, no heat or cold intolerance. No recent change in weight. HEMATOLOGICAL: No anemia or easy bruising or bleeding. NEUROLOGIC: No headache, seizures, numbness, tingling or weakness. PSYCHIATRIC: No depression, no loss of interest in normal activity or change in sleep pattern.     Exam:   BP 136/88   Ht 5' 2.5" (1.588 m)   Wt 158 lb 9.6 oz (71.9 kg)   BMI 28.55 kg/m   Body mass index is 28.55 kg/m.  General appearance : Well developed well  nourished female. No acute distress HEENT: Eyes: no retinal hemorrhage or exudates,  Neck supple, trachea midline, no carotid bruits, no thyroidmegaly Lungs: Clear to auscultation, no rhonchi or wheezes, or rib retractions  Heart: Regular rate and rhythm, no murmurs or gallops Breast:Examined in sitting and supine position were symmetrical in appearance, no palpable masses or tenderness,  no skin retraction, no nipple inversion, no nipple discharge, no skin discoloration, no axillary or supraclavicular lymphadenopathy Abdomen: no palpable masses or tenderness, no rebound or guarding Extremities: no edema or skin discoloration or tenderness  Pelvic: Vulva: Normal             Vagina: No gross lesions or discharge  Cervix: No gross lesions or discharge.    Uterus  AV, normal size, shape and consistency, non-tender and mobile  Adnexa  Without masses or tenderness  Anus: Normal   Assessment/Plan:  74 y.o. female for annual exam   1. Encounter for routine gynecological examination with Papanicolaou smear of cervix Normal gynecologic exam in menopause.  Pap test - October 2019, no indication to repeat.  Breast exam normal.  Last screening mammogram June 2020 was negative.  Colonoscopy April 2019.  Health labs with family physician.  2. Post-menopausal Well on no hormone replacement therapy.  No postmenopausal bleeding.  3. Screening for osteoporosis We will schedule of bone density  here now.  Vitamin D supplements, calcium intake of 1200 mg daily and regular weightbearing physical activity is recommended. - DG Bone Density; Future  4. Overweight (BMI 25.0-29.9) Lower calorie/carb diet recommended, such as [employer].  Recommend increasing aerobic activities to 5 times a week and light weightlifting every 2 days.  Princess Bruins MD, 11:43 AM 07/18/2019

## 2019-07-18 NOTE — Patient Instructions (Signed)
1. Encounter for routine gynecological examination with Papanicolaou smear of cervix Normal gynecologic exam in menopause.  Pap test - October 2019, no indication to repeat.  Breast exam normal.  Last screening mammogram June 2020 was negative.  Colonoscopy April 2019.  Health labs with family physician.  2. Post-menopausal Well on no hormone replacement therapy.  No postmenopausal bleeding.  3. Screening for osteoporosis We will schedule of bone density here now.  Vitamin D supplements, calcium intake of 1200 mg daily and regular weightbearing physical activity is recommended. - DG Bone Density; Future  4. Overweight (BMI 25.0-29.9) Lower calorie/carb diet recommended, such as [employer].  Recommend increasing aerobic activities to 5 times a week and light weightlifting every 2 days.  Michelle Davidson, it was a pleasure seeing you today!

## 2019-07-21 DIAGNOSIS — L820 Inflamed seborrheic keratosis: Secondary | ICD-10-CM | POA: Diagnosis not present

## 2019-07-21 DIAGNOSIS — Z85828 Personal history of other malignant neoplasm of skin: Secondary | ICD-10-CM | POA: Diagnosis not present

## 2019-07-21 DIAGNOSIS — L570 Actinic keratosis: Secondary | ICD-10-CM | POA: Diagnosis not present

## 2019-07-26 ENCOUNTER — Other Ambulatory Visit: Payer: Self-pay | Admitting: Obstetrics & Gynecology

## 2019-07-26 ENCOUNTER — Encounter: Payer: Self-pay | Admitting: Family Medicine

## 2019-07-26 ENCOUNTER — Ambulatory Visit (INDEPENDENT_AMBULATORY_CARE_PROVIDER_SITE_OTHER): Payer: Medicare Other

## 2019-07-26 ENCOUNTER — Other Ambulatory Visit: Payer: Self-pay

## 2019-07-26 DIAGNOSIS — Z780 Asymptomatic menopausal state: Secondary | ICD-10-CM

## 2019-07-26 DIAGNOSIS — M8589 Other specified disorders of bone density and structure, multiple sites: Secondary | ICD-10-CM | POA: Diagnosis not present

## 2019-07-26 DIAGNOSIS — Z13820 Encounter for screening for osteoporosis: Secondary | ICD-10-CM

## 2019-08-03 ENCOUNTER — Other Ambulatory Visit: Payer: Self-pay

## 2019-08-03 ENCOUNTER — Encounter: Payer: Self-pay | Admitting: Family Medicine

## 2019-08-03 MED ORDER — HYDROCHLOROTHIAZIDE 12.5 MG PO CAPS: ORAL_CAPSULE | ORAL | 0 refills | Status: DC

## 2019-10-27 ENCOUNTER — Other Ambulatory Visit: Payer: Self-pay | Admitting: Family Medicine

## 2019-10-27 ENCOUNTER — Telehealth: Payer: Self-pay

## 2019-10-27 MED ORDER — ATENOLOL 25 MG PO TABS: 25.0000 mg | ORAL_TABLET | Freq: Two times a day (BID) | ORAL | 1 refills | Status: DC

## 2019-10-27 NOTE — Telephone Encounter (Signed)
Overdue for office visit

## 2019-10-27 NOTE — Telephone Encounter (Signed)
Last OV 12/03/18 Last fill 08/03/19#90/0

## 2019-10-27 NOTE — Telephone Encounter (Signed)
Last OV 12/03/18 Last fill  05/05/2019  #180/1 Please see message and advise.  Thank you.

## 2019-10-27 NOTE — Addendum Note (Signed)
Addended by: Jon Billings on: 10/27/2019 05:07 PM   Modules accepted: Orders

## 2019-11-10 ENCOUNTER — Telehealth: Payer: Self-pay | Admitting: General Practice

## 2019-11-10 DIAGNOSIS — I10 Essential (primary) hypertension: Secondary | ICD-10-CM

## 2019-11-10 MED ORDER — HYDROCHLOROTHIAZIDE 12.5 MG PO CAPS: 12.5000 mg | ORAL_CAPSULE | Freq: Every day | ORAL | 0 refills | Status: DC

## 2019-11-10 MED ORDER — ATENOLOL 25 MG PO TABS: 25.0000 mg | ORAL_TABLET | Freq: Two times a day (BID) | ORAL | 0 refills | Status: DC

## 2019-11-10 NOTE — Telephone Encounter (Signed)
Patient called and stated that only half of her atenolol and hydrochlorothiazide was sent to the pharmacy and wanted to see if she could get the rest before her appointment on 9/22 or if an ov is needed before appointment. Informed patient of Dr. Donnetta Simpers but patient is requesting a call back form the nurse, please advise. CB is [phone]

## 2019-11-10 NOTE — Telephone Encounter (Signed)
90 day supply of both meds sent to pharm

## 2019-11-10 NOTE — Telephone Encounter (Signed)
Please see message and advise.  Thank you. ° °

## 2019-12-08 DIAGNOSIS — Z961 Presence of intraocular lens: Secondary | ICD-10-CM | POA: Diagnosis not present

## 2019-12-08 DIAGNOSIS — H18413 Arcus senilis, bilateral: Secondary | ICD-10-CM | POA: Diagnosis not present

## 2019-12-08 DIAGNOSIS — H04123 Dry eye syndrome of bilateral lacrimal glands: Secondary | ICD-10-CM | POA: Diagnosis not present

## 2019-12-08 DIAGNOSIS — H43822 Vitreomacular adhesion, left eye: Secondary | ICD-10-CM | POA: Diagnosis not present

## 2019-12-16 ENCOUNTER — Encounter: Payer: Self-pay | Admitting: Family Medicine

## 2019-12-21 DIAGNOSIS — H43393 Other vitreous opacities, bilateral: Secondary | ICD-10-CM | POA: Diagnosis not present

## 2019-12-21 DIAGNOSIS — H43823 Vitreomacular adhesion, bilateral: Secondary | ICD-10-CM | POA: Diagnosis not present

## 2019-12-21 DIAGNOSIS — H43811 Vitreous degeneration, right eye: Secondary | ICD-10-CM | POA: Diagnosis not present

## 2019-12-21 DIAGNOSIS — H4423 Degenerative myopia, bilateral: Secondary | ICD-10-CM | POA: Diagnosis not present

## 2020-01-06 ENCOUNTER — Encounter: Payer: Self-pay | Admitting: Family Medicine

## 2020-01-06 DIAGNOSIS — Z23 Encounter for immunization: Secondary | ICD-10-CM | POA: Diagnosis not present

## 2020-01-17 ENCOUNTER — Other Ambulatory Visit: Payer: Self-pay

## 2020-01-18 ENCOUNTER — Encounter: Payer: Self-pay | Admitting: Family Medicine

## 2020-01-18 ENCOUNTER — Ambulatory Visit (INDEPENDENT_AMBULATORY_CARE_PROVIDER_SITE_OTHER): Payer: Medicare Other | Admitting: Family Medicine

## 2020-01-18 VITALS — BP 136/70 | HR 68 | Temp 97.6°F | Ht 62.5 in | Wt 155.4 lb

## 2020-01-18 DIAGNOSIS — K580 Irritable bowel syndrome with diarrhea: Secondary | ICD-10-CM | POA: Diagnosis not present

## 2020-01-18 DIAGNOSIS — I10 Essential (primary) hypertension: Secondary | ICD-10-CM | POA: Diagnosis not present

## 2020-01-18 DIAGNOSIS — R7303 Prediabetes: Secondary | ICD-10-CM

## 2020-01-18 DIAGNOSIS — E785 Hyperlipidemia, unspecified: Secondary | ICD-10-CM

## 2020-01-18 MED ORDER — HYDROCHLOROTHIAZIDE 12.5 MG PO CAPS: 12.5000 mg | ORAL_CAPSULE | Freq: Every day | ORAL | 3 refills | Status: DC

## 2020-01-18 MED ORDER — ATENOLOL 25 MG PO TABS: 25.0000 mg | ORAL_TABLET | Freq: Two times a day (BID) | ORAL | 3 refills | Status: DC

## 2020-01-18 MED ORDER — AMITRIPTYLINE HCL 25 MG PO TABS: 25.0000 mg | ORAL_TABLET | Freq: Every day | ORAL | 3 refills | Status: DC

## 2020-01-18 NOTE — Progress Notes (Signed)
Michelle Davidson is a 74 y.o. female  Chief Complaint  Patient presents with  . Establish Care    TOC- med refills    HPI: Michelle Davidson is a 74 y.o. female who was previously a patient of Dr. Deborra Medina seen today for [facility] appt and f/u on chronic medical issues and well as refills of her chronic meds. Pt has a PMHx significant for HTN, prediabetes, hyperlipidemia, IBS.  Last labs in 01/2019 (BMP) and 05/2018 (FLP, A1C). She needs refills of both of her HTN meds.    Past Medical History:  Diagnosis Date  . Arthritis   . Basal cell carcinoma   . Choroidal neovascularization    Right eye  . CNVM (choroidal neovascular membrane), right   . Fibromyalgia   . History of chicken pox   . History of colon polyps   . Hyperlipidemia   . Hypertension   . Migraines   . PAC (premature atrial contraction)   . PVC (premature ventricular contraction)     Past Surgical History:  Procedure Laterality Date  . [place] Right 2010  . BREAST BIOPSY Left 2011   Titanium chip implanted to mark the spot/benign findings  . EYE SURGERY Bilateral 03/28/2018   cataract removal with lens implants. Dr.Beavis  . TUBAL LIGATION  1978  . WISDOM TOOTH EXTRACTION      Social History   Socioeconomic History  . Marital status: Widowed    Spouse name: Not on file  . Number of children: 2  . Years of education: Not on file  . Highest education level: Not on file  Occupational History  . Occupation: retired  Tobacco Use  . Smoking status: Former Smoker    Types: Cigarettes    Quit date: 1997    Years since quitting: 24.7  . Smokeless tobacco: Never Used  Vaping Use  . Vaping Use: Never used  Substance and Sexual Activity  . Alcohol use: Yes    Comment: cocktail every night  . Drug use: No  . Sexual activity: Not Currently    Partners: Male    Comment: 1st intercourse- 25, partners- 30, widow  Other Topics Concern  . Not on file  Social History Narrative  . Not on file   Social  Determinants of Health   Financial Resource Strain:   . Difficulty of Paying Living Expenses: Not on file  Food Insecurity:   . Worried About Charity fundraiser in the Last Year: Not on file  . Ran Out of Food in the Last Year: Not on file  Transportation Needs:   . Lack of Transportation (Medical): Not on file  . Lack of Transportation (Non-Medical): Not on file  Physical Activity:   . Days of Exercise per Week: Not on file  . Minutes of Exercise per Session: Not on file  Stress:   . Feeling of Stress : Not on file  Social Connections:   . Frequency of Communication with Friends and Family: Not on file  . Frequency of Social Gatherings with Friends and Family: Not on file  . Attends Religious Services: Not on file  . Active Member of Clubs or Organizations: Not on file  . Attends Archivist Meetings: Not on file  . Marital Status: Not on file  Intimate Partner Violence:   . Fear of Current or Ex-Partner: Not on file  . Emotionally Abused: Not on file  . Physically Abused: Not on file  . Sexually Abused: Not on  file    Family History  Problem Relation Age of Onset  . Arthritis Mother   . Hyperlipidemia Mother   . Hypertension Mother   . Colon cancer Father   . Arthritis Maternal Grandmother   . Heart attack Maternal Grandfather   . Lung cancer Maternal Grandfather        Smoker  . Diabetes Paternal Grandmother   . Uterine cancer Paternal Grandmother   . Skin cancer Paternal Grandfather   . Colon cancer Sister        mets to liver Dec 26, 2018  . Diabetes Paternal Uncle   . Kidney cancer Paternal Uncle   . Breast cancer Cousin   . Stomach cancer Neg Hx   . Esophageal cancer Neg Hx      Immunization History  Administered Date(s) Administered  . Influenza Split 01/27/2011, 01/27/2012, 01/26/2013, 01/26/2014, 02/27/2015  . Influenza, High Dose Seasonal PF 02/09/2017, 02/04/2018, 01/04/2019  . Influenza-Unspecified 01/06/2020  . PFIZER SARS-COV-2  Vaccination 06/10/2019, 07/03/2019  . Pneumococcal Conjugate-13 07/27/2013  . Pneumococcal Polysaccharide-23 04/28/2009  . Tdap 12/15/2016  . Zoster Recombinat (Shingrix) 09/03/2017, 11/30/2017    Outpatient Encounter Medications as of 01/18/2020  Medication Sig  . atenolol (TENORMIN) 25 MG tablet Take 1 tablet (25 mg total) by mouth 2 (two) times daily.  . fluticasone (FLONASE) 50 MCG/ACT nasal spray Place 1 spray into both nostrils daily.  . hydrochlorothiazide (MICROZIDE) 12.5 MG capsule Take 1 capsule (12.5 mg total) by mouth daily.  . hyoscyamine (LEVSIN SL) 0.125 MG SL tablet Place 1 tablet (0.125 mg total) under the tongue every 4 (four) hours as needed.  . Multiple Vitamins-Minerals (CENTRUM SILVER 50+WOMEN PO) Take 1 tablet by mouth daily.  . Turmeric Curcumin 500 MG CAPS    No facility-administered encounter medications on file as of 01/18/2020.     ROS: Pertinent positives and negatives noted in HPI. Remainder of ROS non-contributory   Allergies  Allergen Reactions  . Esomeprazole Magnesium     Syncope  . Lipitor [Atorvastatin Calcium]     Muscle aches  . Zocor [Simvastatin]     Muscle aches  . Nexium [Esomeprazole Magnesium]   . Avelox [Moxifloxacin Hcl In Nacl] Rash    BP 136/70   Pulse 68   Temp 97.6 F (36.4 C) (Temporal)   Ht 5' 2.5" (1.588 m)   Wt 155 lb 6.4 oz (70.5 kg)   SpO2 98%   BMI 27.97 kg/m   BP Readings from Last 3 Encounters:  01/18/20 136/70  07/18/19 136/88  02/17/19 140/70   Pulse Readings from Last 3 Encounters:  01/18/20 68  02/17/19 64  06/09/18 (!) 57   Wt Readings from Last 3 Encounters:  01/18/20 155 lb 6.4 oz (70.5 kg)  07/18/19 158 lb 9.6 oz (71.9 kg)  02/17/19 158 lb 8 oz (71.9 kg)    Physical Exam Vitals reviewed.  Constitutional:      Appearance: Normal appearance. She is not ill-appearing or toxic-appearing.  Cardiovascular:     Rate and Rhythm: Normal rate and regular rhythm.     Pulses: Normal pulses.   Pulmonary:     Effort: Pulmonary effort is normal. No respiratory distress.     Breath sounds: Normal breath sounds.  Musculoskeletal:     Right lower leg: No edema.     Left lower leg: No edema.  Neurological:     Mental Status: She is alert and oriented to person, place, and time. Mental status is at baseline.  Psychiatric:  Mood and Affect: Mood normal.        Behavior: Behavior normal.      A/P:  1. Essential hypertension - controlled, at goal - Comprehensive metabolic panel - CBC Refill: - atenolol (TENORMIN) 25 MG tablet; Take 1 tablet (25 mg total) by mouth 2 (two) times daily.  Dispense: 180 tablet; Refill: 3 - hydrochlorothiazide (MICROZIDE) 12.5 MG capsule; Take 1 capsule (12.5 mg total) by mouth daily.  Dispense: 90 capsule; Refill: 3  2. Irritable bowel syndrome with diarrhea - symptoms ongoing for years - abd pain/cramping, nausea, diarrhea - was on librax for years with good control - last colonoscopy was 07/2017 - 3 polyps - otherwise normail - has seen GI, takes levsin PRN Rx: - amitriptyline 25mg  1 tab po QHS  3. Prediabetes - Hemoglobin A1c  4. Hyperlipidemia, unspecified hyperlipidemia type - Lipid panel  Discussed plan and reviewed medications with patient, including risks, benefits, and potential side effects. Pt expressed understand. All questions answered.  This visit occurred during the SARS-CoV-2 public health emergency.  Safety protocols were in place, including screening questions prior to the visit, additional usage of staff PPE, and extensive cleaning of exam room while observing appropriate contact time as indicated for disinfecting solutions.

## 2020-01-18 NOTE — Addendum Note (Signed)
Addended by: Lynnea Ferrier on: 01/18/2020 02:56 PM   Modules accepted: Orders

## 2020-01-18 NOTE — Patient Instructions (Addendum)
Calcium 1200 mg daily Vit D 800-1000IU daily  Low-FODMAP Eating Plan  FODMAPs (fermentable oligosaccharides, disaccharides, monosaccharides, and polyols) are sugars that are hard for some people to digest. A low-FODMAP eating plan may help some people who have bowel (intestinal) diseases to manage their symptoms. This meal plan can be complicated to follow. Work with a diet and nutrition specialist (dietitian) to make a low-FODMAP eating plan that is right for you. A dietitian can make sure that you get enough nutrition from this diet. What are tips for following this plan? Reading food labels  Check labels for hidden FODMAPs such as: ? High-fructose syrup. ? Honey. ? Agave. ? Natural fruit flavors. ? Onion or garlic powder.  Choose low-FODMAP foods that contain 3-4 grams of fiber per serving.  Check food labels for serving sizes. Eat only one serving at a time to make sure FODMAP levels stay low. Meal planning  Follow a low-FODMAP eating plan for up to 6 weeks, or as told by your health care provider or dietitian.  To follow the eating plan: 1. Eliminate high-FODMAP foods from your diet completely. 2. Gradually reintroduce high-FODMAP foods into your diet one at a time. Most people should wait a few days after introducing one high-FODMAP food before they introduce the next high-FODMAP food. Your dietitian can recommend how quickly you may reintroduce foods. 3. Keep a daily record of what you eat and drink, and make note of any symptoms that you have after eating. 4. Review your daily record with a dietitian regularly. Your dietitian can help you identify which foods you can eat and which foods you should avoid. General tips  Drink enough fluid each day to keep your urine pale yellow.  Avoid processed foods. These often have added sugar and may be high in FODMAPs.  Avoid most dairy products, whole grains, and sweeteners.  Work with a dietitian to make sure you get enough fiber in  your diet. Recommended foods Grains  Gluten-free grains, such as rice, oats, buckwheat, quinoa, corn, polenta, and millet. Gluten-free pasta, bread, or cereal. Rice noodles. Corn tortillas. Vegetables  Eggplant, zucchini, cucumber, peppers, green beans, Brussels sprouts, bean sprouts, lettuce, arugula, kale, Swiss chard, spinach, collard greens, bok choy, summer squash, potato, and tomato. Limited amounts of corn, carrot, and sweet potato. Green parts of scallions. Fruits  Bananas, oranges, lemons, limes, blueberries, raspberries, strawberries, grapes, cantaloupe, honeydew melon, kiwi, papaya, passion fruit, and pineapple. Limited amounts of dried cranberries, banana chips, and shredded coconut. Dairy  Lactose-free milk, yogurt, and kefir. Lactose-free cottage cheese and ice cream. Non-dairy milks, such as almond, coconut, hemp, and rice milk. Yogurts made of non-dairy milks. Limited amounts of goat cheese, brie, mozzarella, parmesan, swiss, and other hard cheeses. Meats and other protein foods  Unseasoned beef, pork, poultry, or fish. Eggs. Berniece Salines. Tofu (firm) and tempeh. Limited amounts of nuts and seeds, such as almonds, walnuts, [place] nuts, pecans, peanuts, pumpkin seeds, chia seeds, and sunflower seeds. Fats and oils  Butter-free spreads. Vegetable oils, such as olive, canola, and sunflower oil. Seasoning and other foods  Artificial sweeteners with names that do not end in "ol" such as aspartame, saccharine, and stevia. Maple syrup, white table sugar, raw sugar, brown sugar, and molasses. Fresh basil, coriander, parsley, rosemary, and thyme. Beverages  Water and mineral water. Sugar-sweetened soft drinks. Small amounts of orange juice or cranberry juice. Black and green tea. Most dry wines. Coffee. This may not be a complete list of low-FODMAP foods. Talk with your dietitian  for more information. Foods to avoid Grains  Wheat, including kamut, durum, and semolina. Barley and bulgur.  Couscous. Wheat-based cereals. Wheat noodles, bread, crackers, and pastries. Vegetables  Chicory root, artichoke, asparagus, cabbage, snow peas, sugar snap peas, mushrooms, and cauliflower. Onions, garlic, leeks, and the white part of scallions. Fruits  Fresh, dried, and juiced forms of apple, pear, watermelon, peach, plum, cherries, apricots, blackberries, boysenberries, figs, nectarines, and mango. Avocado. Dairy  Milk, yogurt, ice cream, and soft cheese. Cream and sour cream. Milk-based sauces. Custard. Meats and other protein foods  Fried or fatty meat. Sausage. Cashews and pistachios. Soybeans, baked beans, black beans, chickpeas, kidney beans, fava beans, navy beans, lentils, and split peas. Seasoning and other foods  Any sugar-free gum or candy. Foods that contain artificial sweeteners such as sorbitol, mannitol, isomalt, or xylitol. Foods that contain honey, high-fructose corn syrup, or agave. Bouillon, vegetable stock, beef stock, and chicken stock. Garlic and onion powder. Condiments made with onion, such as hummus, chutney, pickles, relish, salad dressing, and salsa. Tomato paste. Beverages  Chicory-based drinks. Coffee substitutes. Chamomile tea. Fennel tea. Sweet or fortified wines such as port or sherry. Diet soft drinks made with isomalt, mannitol, maltitol, sorbitol, or xylitol. Apple, pear, and mango juice. Juices with high-fructose corn syrup. This may not be a complete list of high-FODMAP foods. Talk with your dietitian to discuss what dietary choices are best for you.  Summary  A low-FODMAP eating plan is a short-term diet that eliminates FODMAPs from your diet to help ease symptoms of certain bowel diseases.  The eating plan usually lasts up to 6 weeks. After that, high-FODMAP foods are restarted gradually, one at a time, so you can find out which may be causing symptoms.  A low-FODMAP eating plan can be complicated. It is best to work with a dietitian who has  experience with this type of plan. This information is not intended to replace advice given to you by your health care provider. Make sure you discuss any questions you have with your health care provider. Document Revised: 03/27/2017 Document Reviewed: 12/09/2016 Elsevier Patient Education  [place].

## 2020-01-19 ENCOUNTER — Other Ambulatory Visit (INDEPENDENT_AMBULATORY_CARE_PROVIDER_SITE_OTHER): Payer: Medicare Other

## 2020-01-19 ENCOUNTER — Encounter: Payer: Self-pay | Admitting: Family Medicine

## 2020-01-19 DIAGNOSIS — R7303 Prediabetes: Secondary | ICD-10-CM | POA: Diagnosis not present

## 2020-01-19 DIAGNOSIS — I10 Essential (primary) hypertension: Secondary | ICD-10-CM | POA: Diagnosis not present

## 2020-01-19 DIAGNOSIS — E785 Hyperlipidemia, unspecified: Secondary | ICD-10-CM

## 2020-01-19 LAB — CBC
HCT: 38.6 % (ref 36.0–46.0)
Hemoglobin: 13.2 g/dL (ref 12.0–15.0)
MCHC: 34.3 g/dL (ref 30.0–36.0)
MCV: 91.4 fl (ref 78.0–100.0)
Platelets: 296 K/uL (ref 150.0–400.0)
RBC: 4.22 Mil/uL (ref 3.87–5.11)
RDW: 13 % (ref 11.5–15.5)
WBC: 6.6 K/uL (ref 4.0–10.5)

## 2020-01-19 LAB — HEMOGLOBIN A1C: Hgb A1c MFr Bld: 6 % (ref 4.6–6.5)

## 2020-01-19 LAB — COMPREHENSIVE METABOLIC PANEL
ALT: 17 U/L (ref 0–35)
AST: 18 U/L (ref 0–37)
Albumin: 4.4 g/dL (ref 3.5–5.2)
Alkaline Phosphatase: 62 U/L (ref 39–117)
BUN: 14 mg/dL (ref 6–23)
CO2: 32 mEq/L (ref 19–32)
Calcium: 9.8 mg/dL (ref 8.4–10.5)
Chloride: 95 mEq/L — ABNORMAL LOW (ref 96–112)
Creatinine, Ser: 0.73 mg/dL (ref 0.40–1.20)
GFR: 77.61 mL/min (ref >60.00)
Glucose, Bld: 92 mg/dL (ref 70–99)
Potassium: 4.5 mEq/L (ref 3.5–5.1)
Sodium: 133 mEq/L — ABNORMAL LOW (ref 135–145)
Total Bilirubin: 0.7 mg/dL (ref 0.2–1.2)
Total Protein: 7.1 g/dL (ref 6.0–8.3)

## 2020-01-19 LAB — LIPID PANEL
Cholesterol: 230 mg/dL — ABNORMAL HIGH (ref 0–200)
HDL: 71.4 mg/dL
LDL Cholesterol: 137 mg/dL — ABNORMAL HIGH (ref 0–99)
NonHDL: 158.57
Total CHOL/HDL Ratio: 3
Triglycerides: 108 mg/dL (ref 0.0–149.0)
VLDL: 21.6 mg/dL (ref 0.0–40.0)

## 2020-01-20 ENCOUNTER — Encounter: Payer: Self-pay | Admitting: Family Medicine

## 2020-01-23 ENCOUNTER — Encounter: Payer: Self-pay | Admitting: Family Medicine

## 2020-01-25 DIAGNOSIS — Z23 Encounter for immunization: Secondary | ICD-10-CM | POA: Diagnosis not present

## 2020-01-26 ENCOUNTER — Ambulatory Visit (INDEPENDENT_AMBULATORY_CARE_PROVIDER_SITE_OTHER): Payer: Medicare Other

## 2020-01-26 VITALS — Ht 62.0 in | Wt 155.0 lb

## 2020-01-26 DIAGNOSIS — Z0000 Encounter for general adult medical examination without abnormal findings: Secondary | ICD-10-CM

## 2020-01-26 NOTE — Progress Notes (Addendum)
Subjective:   Michelle Davidson is a 74 y.o. female who presents for Medicare Annual (Subsequent) preventive examination.  I connected with [place] today by telephone and verified that I am speaking with the correct person using two identifiers. Location patient: home Location provider: work Persons participating in the virtual visit: patient, Marine scientist.    I discussed the limitations, risks, security and privacy concerns of performing an evaluation and management service by telephone and the availability of in person appointments. I also discussed with the patient that there may be a patient responsible charge related to this service. The patient expressed understanding and verbally consented to this telephonic visit.    Interactive audio and video telecommunications were attempted between this provider and patient, however failed, due to patient having technical difficulties OR patient did not have access to video capability.  We continued and completed visit with audio only.  Some vital signs may be absent or patient reported.   Time Spent with patient on telephone encounter: 25 minutes  Review of Systems     Cardiac Risk Factors include: advanced age (>69men, >84 women);hypertension;dyslipidemia;sedentary lifestyle     Objective:    Today's Vitals   01/26/20 1028  Weight: 155 lb (70.3 kg)  Height: 5\' 2"  (1.575 m)   Body mass index is 28.35 kg/m.  Advanced Directives 01/26/2020 06/09/2018  Does Patient Have a Medical Advance Directive? Yes Yes  Type of Paramedic of [place];Living will [place];Living will  Does patient want to make changes to medical advance directive? - No - Patient declined  Copy of [place] in Chart? Yes - validated most recent copy scanned in chart (See row information) No - copy requested    Current Medications (verified) Outpatient Encounter Medications as of 01/26/2020  Medication Sig  .  amitriptyline (ELAVIL) 25 MG tablet Take 1 tablet (25 mg total) by mouth at bedtime.  Marland Kitchen atenolol (TENORMIN) 25 MG tablet Take 1 tablet (25 mg total) by mouth 2 (two) times daily.  . fluticasone (FLONASE) 50 MCG/ACT nasal spray Place 1 spray into both nostrils daily.  . hydrochlorothiazide (MICROZIDE) 12.5 MG capsule Take 1 capsule (12.5 mg total) by mouth daily.  . hyoscyamine (LEVSIN SL) 0.125 MG SL tablet Place 1 tablet (0.125 mg total) under the tongue every 4 (four) hours as needed.  . Multiple Vitamins-Minerals (CENTRUM SILVER 50+WOMEN PO) Take 1 tablet by mouth daily.  . Turmeric Curcumin 500 MG CAPS    No facility-administered encounter medications on file as of 01/26/2020.    Allergies (verified) Esomeprazole magnesium, Lipitor [atorvastatin calcium], Zocor [simvastatin], Nexium [esomeprazole magnesium], and Avelox [moxifloxacin hcl in nacl]   History: Past Medical History:  Diagnosis Date  . Arthritis   . Basal cell carcinoma   . Choroidal neovascularization    Right eye  . CNVM (choroidal neovascular membrane), right   . Fibromyalgia   . History of chicken pox   . History of colon polyps   . Hyperlipidemia   . Hypertension   . Migraines   . PAC (premature atrial contraction)   . PVC (premature ventricular contraction)    Past Surgical History:  Procedure Laterality Date  . [place] Right 2010  . BREAST BIOPSY Left 2011   Titanium chip implanted to mark the spot/benign findings  . EYE SURGERY Bilateral 03/28/2018   cataract removal with lens implants. Dr.Beavis  . TUBAL LIGATION  1978  . WISDOM TOOTH EXTRACTION     Family History  Problem Relation Age of Onset  . Arthritis Mother   . Hyperlipidemia Mother   . Hypertension Mother   . Colon cancer Father   . Arthritis Maternal Grandmother   . Heart attack Maternal Grandfather   . Lung cancer Maternal Grandfather        Smoker  . Diabetes Paternal Grandmother   . Uterine cancer Paternal  Grandmother   . Skin cancer Paternal Grandfather   . Colon cancer Sister        mets to liver Dec 26, 2018  . Diabetes Paternal Uncle   . Kidney cancer Paternal Uncle   . Breast cancer Cousin   . Stomach cancer Neg Hx   . Esophageal cancer Neg Hx    Social History   Socioeconomic History  . Marital status: Widowed    Spouse name: Not on file  . Number of children: 2  . Years of education: Not on file  . Highest education level: Not on file  Occupational History  . Occupation: retired  Tobacco Use  . Smoking status: Former Smoker    Types: Cigarettes    Quit date: 1997    Years since quitting: 24.7  . Smokeless tobacco: Never Used  Vaping Use  . Vaping Use: Never used  Substance and Sexual Activity  . Alcohol use: Yes    Comment: cocktail every night  . Drug use: No  . Sexual activity: Not Currently    Partners: Male    Comment: 1st intercourse- 27, partners- 4, widow  Other Topics Concern  . Not on file  Social History Narrative  . Not on file   Social Determinants of Health   Financial Resource Strain: Low Risk   . Difficulty of Paying Living Expenses: Not hard at all  Food Insecurity: No Food Insecurity  . Worried About Charity fundraiser in the Last Year: Never true  . Ran Out of Food in the Last Year: Never true  Transportation Needs: No Transportation Needs  . Lack of Transportation (Medical): No  . Lack of Transportation (Non-Medical): No  Physical Activity: Inactive  . Days of Exercise per Week: 0 days  . Minutes of Exercise per Session: 0 min  Stress: No Stress Concern Present  . Feeling of Stress : Not at all  Social Connections: Moderately Isolated  . Frequency of Communication with Friends and Family: More than three times a week  . Frequency of Social Gatherings with Friends and Family: Once a week  . Attends Religious Services: Never  . Active Member of Clubs or Organizations: Yes  . Attends Archivist Meetings: More than 4 times per  year  . Marital Status: Widowed    Tobacco Counseling Counseling given: Not Answered   Clinical Intake:  Pre-visit preparation completed: Yes  Pain : No/denies pain     Nutritional Status: BMI 25 -29 Overweight Nutritional Risks: None Diabetes: No  How often do you need to have someone help you when you read instructions, pamphlets, or other written materials from your doctor or pharmacy?: 1 - Never What is the last grade level you completed in school?: some college  Diabetic? No  Interpreter Needed?: No  Information entered by :: Caroleen Hamman LPN   Activities of Daily Living In your present state of health, do you have any difficulty performing the following activities: 01/26/2020  Hearing? N  Vision? N  Difficulty concentrating or making decisions? N  Walking or climbing stairs? N  Dressing or bathing? N  Doing  errands, shopping? N  Preparing Food and eating ? N  Using the Toilet? N  In the past six months, have you accidently leaked urine? Y  Comment occasional  Do you have problems with loss of bowel control? N  Managing your Medications? N  Managing your Finances? N  Housekeeping or managing your Housekeeping? N  Some recent data might be hidden    Patient Care Team: Ronnald Nian, DO as PCP - General (Family Medicine)  Indicate any recent Medical Services you may have received from other than [facility] providers in the past year (date may be approximate).     Assessment:   This is a routine wellness examination for Michelle Davidson.  Hearing/Vision screen  Hearing Screening   125Hz  250Hz  500Hz  1000Hz  2000Hz  3000Hz  4000Hz  6000Hz  8000Hz   Right ear:           Left ear:           Comments: Mild hearing loss  Vision Screening Comments: Cataracts removed 2020 Wears reading glasses Last eye exam-12/2019-Dr. Baird Cancer  Dietary issues and exercise activities discussed: Current Exercise Habits: The patient does not participate in regular exercise at present,  Exercise limited by: None identified  Goals    . Increase physical activity      Depression Screen PHQ 2/9 Scores 01/26/2020 01/18/2020 01/18/2020 06/09/2018 05/19/2017  PHQ - 2 Score 1 3 2  0 6  PHQ- 9 Score - 7 - - 14    Fall Risk Fall Risk  01/26/2020 01/18/2020 06/09/2018 06/09/2018 05/19/2017  Falls in the past year? 1 0 0 0 No  Number falls in past yr: 0 0 - - -  Injury with Fall? 0 0 - - -  Follow up Falls prevention discussed - - Falls evaluation completed -    Any stairs in or around the home? No  Home free of loose throw rugs in walkways, pet beds, electrical cords, etc? Yes  Adequate lighting in your home to reduce risk of falls? Yes   ASSISTIVE DEVICES UTILIZED TO PREVENT FALLS:  Life alert? No  Use of a cane, walker or w/c? No  Grab bars in the bathroom? Yes  Shower chair or bench in shower? Yes  Elevated toilet seat or a handicapped toilet? No   TIMED UP AND GO:  Was the test performed? No . Phone visit   Cognitive Function:No cognitive impairment noted        Immunizations Immunization History  Administered Date(s) Administered  . Influenza Split 01/27/2011, 01/27/2012, 01/26/2013, 01/26/2014, 02/27/2015  . Influenza, High Dose Seasonal PF 02/09/2017, 02/04/2018, 01/04/2019  . Influenza-Unspecified 01/06/2020  . PFIZER SARS-COV-2 Vaccination 06/10/2019, 07/03/2019, 01/25/2020  . Pneumococcal Conjugate-13 07/27/2013  . Pneumococcal Polysaccharide-23 04/28/2009  . Tdap 12/15/2016  . Zoster Recombinat (Shingrix) 09/03/2017, 11/30/2017    TDAP status: Up to date   Flu Vaccine status: Up to date   Pneumococcal vaccine status: Up to date   Covid-19 vaccine status: Completed vaccines  Qualifies for Shingles Vaccine? No   Zostavax completed No   Shingrix Completed?: Yes  Screening Tests Health Maintenance  Topic Date Due  . COLONOSCOPY  08/05/2020  . TETANUS/TDAP  12/16/2026  . INFLUENZA VACCINE  Completed  . DEXA SCAN  Completed  . COVID-19  Vaccine  Completed  . Hepatitis C Screening  Completed  . PNA vac Low Risk Adult  Discontinued    Health Maintenance  There are no preventive care reminders to display for this patient.  Colorectal cancer screening: No longer required.  Mammogram status: Completed 09/2019-per patient. Repeat every year  Patient to have results sent to PCP  Bone Density status: Completed 07/06/2019. Results reflect: Bone density results: OSTEOPENIA. Repeat every 2 years.  Lung Cancer Screening: (Low Dose CT Chest recommended if Age 32-80 years, 30 pack-year currently smoking OR have quit w/in 15years.) does not qualify.    Additional Screening:  Hepatitis C Screening:Completed 09/16/2017  Vision Screening: Recommended annual ophthalmology exams for early detection of glaucoma and other disorders of the eye. Is the patient up to date with their annual eye exam?  Yes  Who is the provider or what is the name of the office in which the patient attends annual eye exams? Dr. Baird Cancer [facility]  Dental Screening: Recommended annual dental exams for proper oral hygiene  Community Resource Referral / Chronic Care Management: CRR required this visit?  No   CCM required this visit?  No      Plan:     I have personally reviewed and noted the following in the patient's chart:   . Medical and social history . Use of alcohol, tobacco or illicit drugs  . Current medications and supplements . Functional ability and status . Nutritional status . Physical activity . Advanced directives . List of other physicians . Hospitalizations, surgeries, and ER visits in previous 12 months . Vitals . Screenings to include cognitive, depression, and falls . Referrals and appointments  In addition, I have reviewed and discussed with patient certain preventive protocols, quality metrics, and best practice recommendations. A written personalized care plan for preventive services as well as general preventive health  recommendations were provided to patient.  Due to this being a telephonic visit, the after visit summary with patients personalized plan was offered to patient via mail or my-chart. Patient would like to access on my-chart     Marta Antu, LPN   [id number]  Nurse Health Advisor  Nurse Notes: None

## 2020-01-26 NOTE — Patient Instructions (Signed)
Michelle Davidson , Thank you for taking time to complete your Medicare Wellness Visit. I appreciate your ongoing commitment to your health goals. Please review the following plan we discussed and let me know if I can assist you in the future.   Screening recommendations/referrals: Colonoscopy: No longer indicated Mammogram: Please have recent results sent to Dr. Bryan Lemma. Bone Density: Completed 07/26/2019-Due-07/25/2021 Recommended yearly ophthalmology/optometry visit for glaucoma screening and checkup Recommended yearly dental visit for hygiene and checkup  Vaccinations: Influenza vaccine: Up to Date Pneumococcal vaccine: Completed vaccines Tdap vaccine: Up to Date-Due-12/16/2026 Shingles vaccine: Completed  vaccines Covid-19:Completed vaccines  Advanced directives: Copy on file  Conditions/risks identified: See problem list  Next appointment: Follow up in one year for your annual wellness visit 01/31/2021 @ 10:30am   Preventive Care 65 Years and Older, Female Preventive care refers to lifestyle choices and visits with your health care provider that can promote health and wellness. What does preventive care include?  A yearly physical exam. This is also called an annual well check.  Dental exams once or twice a year.  Routine eye exams. Ask your health care provider how often you should have your eyes checked.  Personal lifestyle choices, including:  Daily care of your teeth and gums.  Regular physical activity.  Eating a healthy diet.  Avoiding tobacco and drug use.  Limiting alcohol use.  Practicing safe sex.  Taking low-dose aspirin every day.  Taking vitamin and mineral supplements as recommended by your health care provider. What happens during an annual well check? The services and screenings done by your health care provider during your annual well check will depend on your age, overall health, lifestyle risk factors, and family history of disease. Counseling   Your health care provider may ask you questions about your:  Alcohol use.  Tobacco use.  Drug use.  Emotional well-being.  Home and relationship well-being.  Sexual activity.  Eating habits.  History of falls.  Memory and ability to understand (cognition).  Work and work Statistician.  Reproductive health. Screening  You may have the following tests or measurements:  Height, weight, and BMI.  Blood pressure.  Lipid and cholesterol levels. These may be checked every 5 years, or more frequently if you are over 32 years old.  Skin check.  Lung cancer screening. You may have this screening every year starting at age 57 if you have a 30-pack-year history of smoking and currently smoke or have quit within the past 15 years.  Fecal occult blood test (FOBT) of the stool. You may have this test every year starting at age 44.  Flexible sigmoidoscopy or colonoscopy. You may have a sigmoidoscopy every 5 years or a colonoscopy every 10 years starting at age 26.  Hepatitis C blood test.  Hepatitis B blood test.  Sexually transmitted disease (STD) testing.  Diabetes screening. This is done by checking your blood sugar (glucose) after you have not eaten for a while (fasting). You may have this done every 1-3 years.  Bone density scan. This is done to screen for osteoporosis. You may have this done starting at age 26.  Mammogram. This may be done every 1-2 years. Talk to your health care provider about how often you should have regular mammograms. Talk with your health care provider about your test results, treatment options, and if necessary, the need for more tests. Vaccines  Your health care provider may recommend certain vaccines, such as:  Influenza vaccine. This is recommended every year.  Tetanus, diphtheria,  and acellular pertussis (Tdap, Td) vaccine. You may need a Td booster every 10 years.  Zoster vaccine. You may need this after age 61.  Pneumococcal 13-valent  conjugate (PCV13) vaccine. One dose is recommended after age 45.  Pneumococcal polysaccharide (PPSV23) vaccine. One dose is recommended after age 95. Talk to your health care provider about which screenings and vaccines you need and how often you need them. This information is not intended to replace advice given to you by your health care provider. Make sure you discuss any questions you have with your health care provider. Document Released: 05/11/2015 Document Revised: 01/02/2016 Document Reviewed: 02/13/2015 Elsevier Interactive Patient Education  2017 [place] Prevention in the Home Falls can cause injuries. They can happen to people of all ages. There are many things you can do to make your home safe and to help prevent falls. What can I do on the outside of my home?  Regularly fix the edges of walkways and driveways and fix any cracks.  Remove anything that might make you trip as you walk through a door, such as a raised step or threshold.  Trim any bushes or trees on the path to your home.  Use bright outdoor lighting.  Clear any walking paths of anything that might make someone trip, such as rocks or tools.  Regularly check to see if handrails are loose or broken. Make sure that both sides of any steps have handrails.  Any raised decks and porches should have guardrails on the edges.  Have any leaves, snow, or ice cleared regularly.  Use sand or salt on walking paths during winter.  Clean up any spills in your garage right away. This includes oil or grease spills. What can I do in the bathroom?  Use night lights.  Install grab bars by the toilet and in the tub and shower. Do not use towel bars as grab bars.  Use non-skid mats or decals in the tub or shower.  If you need to sit down in the shower, use a plastic, non-slip stool.  Keep the floor dry. Clean up any water that spills on the floor as soon as it happens.  Remove soap buildup in the tub or  shower regularly.  Attach bath mats securely with double-sided non-slip rug tape.  Do not have throw rugs and other things on the floor that can make you trip. What can I do in the bedroom?  Use night lights.  Make sure that you have a light by your bed that is easy to reach.  Do not use any sheets or blankets that are too big for your bed. They should not hang down onto the floor.  Have a firm chair that has side arms. You can use this for support while you get dressed.  Do not have throw rugs and other things on the floor that can make you trip. What can I do in the kitchen?  Clean up any spills right away.  Avoid walking on wet floors.  Keep items that you use a lot in easy-to-reach places.  If you need to reach something above you, use a strong step stool that has a grab bar.  Keep electrical cords out of the way.  Do not use floor polish or wax that makes floors slippery. If you must use wax, use non-skid floor wax.  Do not have throw rugs and other things on the floor that can make you trip. What can I do with my  stairs?  Do not leave any items on the stairs.  Make sure that there are handrails on both sides of the stairs and use them. Fix handrails that are broken or loose. Make sure that handrails are as long as the stairways.  Check any carpeting to make sure that it is firmly attached to the stairs. Fix any carpet that is loose or worn.  Avoid having throw rugs at the top or bottom of the stairs. If you do have throw rugs, attach them to the floor with carpet tape.  Make sure that you have a light switch at the top of the stairs and the bottom of the stairs. If you do not have them, ask someone to add them for you. What else can I do to help prevent falls?  Wear shoes that:  Do not have high heels.  Have rubber bottoms.  Are comfortable and fit you well.  Are closed at the toe. Do not wear sandals.  If you use a stepladder:  Make sure that it is fully  opened. Do not climb a closed stepladder.  Make sure that both sides of the stepladder are locked into place.  Ask someone to hold it for you, if possible.  Clearly mark and make sure that you can see:  Any grab bars or handrails.  First and last steps.  Where the edge of each step is.  Use tools that help you move around (mobility aids) if they are needed. These include:  Canes.  Walkers.  Scooters.  Crutches.  Turn on the lights when you go into a dark area. Replace any light bulbs as soon as they burn out.  Set up your furniture so you have a clear path. Avoid moving your furniture around.  If any of your floors are uneven, fix them.  If there are any pets around you, be aware of where they are.  Review your medicines with your doctor. Some medicines can make you feel dizzy. This can increase your chance of falling. Ask your doctor what other things that you can do to help prevent falls. This information is not intended to replace advice given to you by your health care provider. Make sure you discuss any questions you have with your health care provider. Document Released: 02/08/2009 Document Revised: 09/20/2015 Document Reviewed: 05/19/2014 Elsevier Interactive Patient Education  2017 [employer].

## 2020-01-27 ENCOUNTER — Other Ambulatory Visit: Payer: Self-pay | Admitting: Obstetrics & Gynecology

## 2020-01-27 DIAGNOSIS — Z0000 Encounter for general adult medical examination without abnormal findings: Secondary | ICD-10-CM

## 2020-04-16 ENCOUNTER — Encounter: Payer: Self-pay | Admitting: Family Medicine

## 2020-04-16 MED ORDER — FLUTICASONE PROPIONATE 50 MCG/ACT NA SUSP: 1.0000 | Freq: Every day | NASAL | 11 refills | Status: DC

## 2020-06-13 ENCOUNTER — Other Ambulatory Visit: Payer: Self-pay

## 2020-06-13 ENCOUNTER — Ambulatory Visit
Admission: RE | Admit: 2020-06-13 | Discharge: 2020-06-13 | Disposition: A | Discharge status: Home / Self Care | Payer: Medicare Other | Source: Ambulatory Visit | Attending: Obstetrics & Gynecology | Admitting: Obstetrics & Gynecology

## 2020-06-13 DIAGNOSIS — Z0000 Encounter for general adult medical examination without abnormal findings: Secondary | ICD-10-CM

## 2020-06-13 DIAGNOSIS — Z1231 Encounter for screening mammogram for malignant neoplasm of breast: Secondary | ICD-10-CM | POA: Diagnosis not present

## 2020-07-11 DIAGNOSIS — H4423 Degenerative myopia, bilateral: Secondary | ICD-10-CM | POA: Diagnosis not present

## 2020-07-11 DIAGNOSIS — H43823 Vitreomacular adhesion, bilateral: Secondary | ICD-10-CM | POA: Diagnosis not present

## 2020-07-11 DIAGNOSIS — H43393 Other vitreous opacities, bilateral: Secondary | ICD-10-CM | POA: Diagnosis not present

## 2020-07-11 DIAGNOSIS — H43811 Vitreous degeneration, right eye: Secondary | ICD-10-CM | POA: Diagnosis not present

## 2020-07-18 ENCOUNTER — Encounter: Payer: Medicare Other | Admitting: Obstetrics & Gynecology

## 2020-07-30 ENCOUNTER — Ambulatory Visit: Payer: Medicare Other | Admitting: Obstetrics & Gynecology

## 2020-08-27 ENCOUNTER — Encounter: Payer: Self-pay | Admitting: Gastroenterology

## 2020-08-31 ENCOUNTER — Encounter: Payer: Self-pay | Admitting: Family Medicine

## 2020-08-31 DIAGNOSIS — Z23 Encounter for immunization: Secondary | ICD-10-CM | POA: Diagnosis not present

## 2020-10-03 ENCOUNTER — Ambulatory Visit: Payer: Medicare Other | Admitting: Obstetrics & Gynecology

## 2020-10-28 ENCOUNTER — Encounter: Payer: Self-pay | Admitting: Family Medicine

## 2020-11-05 ENCOUNTER — Other Ambulatory Visit: Payer: Self-pay

## 2020-11-05 ENCOUNTER — Encounter: Payer: Self-pay | Admitting: Obstetrics & Gynecology

## 2020-11-05 ENCOUNTER — Other Ambulatory Visit (HOSPITAL_COMMUNITY)
Admission: RE | Admit: 2020-11-05 | Discharge: 2020-11-05 | Disposition: A | Discharge status: Home / Self Care | Payer: Medicare Other | Source: Ambulatory Visit | Attending: Obstetrics & Gynecology | Admitting: Obstetrics & Gynecology

## 2020-11-05 ENCOUNTER — Ambulatory Visit (INDEPENDENT_AMBULATORY_CARE_PROVIDER_SITE_OTHER): Payer: Medicare Other | Admitting: Obstetrics & Gynecology

## 2020-11-05 VITALS — BP 142/80 | HR 68 | Resp 14 | Ht 62.0 in | Wt 154.0 lb

## 2020-11-05 DIAGNOSIS — Z01419 Encounter for gynecological examination (general) (routine) without abnormal findings: Secondary | ICD-10-CM

## 2020-11-05 DIAGNOSIS — M8589 Other specified disorders of bone density and structure, multiple sites: Secondary | ICD-10-CM

## 2020-11-05 DIAGNOSIS — Z780 Asymptomatic menopausal state: Secondary | ICD-10-CM

## 2020-11-05 NOTE — Progress Notes (Signed)
Michelle Davidson [birth year] [id number]   History:    74 y.o. . G2P2L2 Widowed.  Boyfriend.   RP:  Established patient presenting for annual gyn exam   HPI: Postmenopause, well on no HRT.  No postmenopausal bleeding.  No pelvic pain.  Occasionally sexually active.  Urine and bowel movements normal.  Breasts normal.  Body mass index 28.17.  Needs to increase fitness activities.  Health labs with Fam MD. Michelle Davidson 2019.  BD 06/2019 Osteopenia.   Past medical history,surgical history, family history and social history were all reviewed and documented in the EPIC chart.  Gynecologic History No LMP recorded. Patient is postmenopausal.  Obstetric History OB History  Gravida Para Term Preterm AB Living  2 2       2   SAB IAB Ectopic Multiple Live Births               # Outcome Date GA Lbr Len/2nd Weight Sex Delivery Anes PTL Lv  2 Para           1 Para              ROS: A ROS was performed and pertinent positives and negatives are included in the history.  GENERAL: No fevers or chills. HEENT: No change in vision, no earache, sore throat or sinus congestion. NECK: No pain or stiffness. CARDIOVASCULAR: No chest pain or pressure. No palpitations. PULMONARY: No shortness of breath, cough or wheeze. GASTROINTESTINAL: No abdominal pain, nausea, vomiting or diarrhea, melena or bright red blood per rectum. GENITOURINARY: No urinary frequency, urgency, hesitancy or dysuria. MUSCULOSKELETAL: No joint or muscle pain, no back pain, no recent trauma. DERMATOLOGIC: No rash, no itching, no lesions. ENDOCRINE: No polyuria, polydipsia, no heat or cold intolerance. No recent change in weight. HEMATOLOGICAL: No anemia or easy bruising or bleeding. NEUROLOGIC: No headache, seizures, numbness, tingling or weakness. PSYCHIATRIC: No depression, no loss of interest in normal activity or change in sleep pattern.     Exam:   BP (!) 142/80 (BP Location: Right Arm, Patient Position: Sitting, Cuff Size: Normal)   Pulse  68   Resp 14   Ht 5\' 2"  (1.575 m)   Wt 154 lb (69.9 kg)   BMI 28.17 kg/m   Body mass index is 28.17 kg/m.  General appearance : Well developed well nourished female. No acute distress HEENT: Eyes: no retinal hemorrhage or exudates,  Neck supple, trachea midline, no carotid bruits, no thyroidmegaly Lungs: Clear to auscultation, no rhonchi or wheezes, or rib retractions  Heart: Regular rate and rhythm, no murmurs or gallops Breast:Examined in sitting and supine position were symmetrical in appearance, no palpable masses or tenderness,  no skin retraction, no nipple inversion, no nipple discharge, no skin discoloration, no axillary or supraclavicular lymphadenopathy Abdomen: no palpable masses or tenderness, no rebound or guarding Extremities: no edema or skin discoloration or tenderness  Pelvic: Vulva: Normal             Vagina: No gross lesions or discharge  Cervix: No gross lesions or discharge.  Pap reflex done.  Uterus  AV, normal size, shape and consistency, non-tender and mobile  Adnexa  Without masses or tenderness  Anus: Normal   Assessment/Plan:  75 y.o. female for annual exam   1. Encounter for routine gynecological examination with Papanicolaou smear of cervix Normal gynecologic exam in menopause.  Pap reflex done.  Breast exam normal.  Screening mammogram February 2022 was negative.  Colonoscopy 2019.  Body mass index 28.17.  Needs to increase fitness activities.  Healthy nutrition.  Health labs with family physician. - Cytology - PAP( [facility])  2. Post-menopausal Well on no hormone replacement therapy.  No postmenopausal bleeding.  3. Osteopenia of multiple sites  Osteopenia on bone density March 2021 with a T score of -1.6 at bilateral femoral necks.  We will repeat a bone density at 2 to 3 years.  Vitamin D supplements, calcium intake of 1.5 g/day total and regular weightbearing physical activity is recommended.  Michelle Bruins MD, 11:28 AM 11/05/2020

## 2020-11-08 LAB — CYTOLOGY - PAP: Diagnosis: NEGATIVE

## 2020-12-07 ENCOUNTER — Other Ambulatory Visit: Payer: Self-pay | Admitting: Family Medicine

## 2020-12-07 DIAGNOSIS — K580 Irritable bowel syndrome with diarrhea: Secondary | ICD-10-CM

## 2020-12-07 NOTE — Telephone Encounter (Signed)
Refill request for: Amitriptyline 25 mg LR 01/18/20,#90, 3 rf LOV  01/26/20 FOV  none scheduled.  Please review and advise.   Thanks.  Dm/cma

## 2020-12-19 DIAGNOSIS — M71341 Other bursal cyst, right hand: Secondary | ICD-10-CM | POA: Diagnosis not present

## 2021-01-02 ENCOUNTER — Other Ambulatory Visit: Payer: Self-pay | Admitting: Orthopedic Surgery

## 2021-01-02 ENCOUNTER — Telehealth: Payer: Self-pay

## 2021-01-02 DIAGNOSIS — M19041 Primary osteoarthritis, right hand: Secondary | ICD-10-CM | POA: Diagnosis not present

## 2021-01-02 DIAGNOSIS — M79644 Pain in right finger(s): Secondary | ICD-10-CM | POA: Diagnosis not present

## 2021-01-02 DIAGNOSIS — M71341 Other bursal cyst, right hand: Secondary | ICD-10-CM | POA: Diagnosis not present

## 2021-01-02 DIAGNOSIS — M6740 Ganglion, unspecified site: Secondary | ICD-10-CM | POA: Diagnosis not present

## 2021-01-02 NOTE — Telephone Encounter (Signed)
Received a Pre-OP clearance for finger surgery scheduled 01/11/21 from "[name]", spoke to [name] @ [phone] and advised that last OV was 01/17/20 with Dr Bryan Lemma and she would need to have an appointment to have form filled out.  Michelle Davidson had already left VM for patient to touch base with them to advise that she was going to need an appointment.  Form on my desk awaiting a scheduled appointment. Dm/cma

## 2021-01-03 ENCOUNTER — Encounter (HOSPITAL_BASED_OUTPATIENT_CLINIC_OR_DEPARTMENT_OTHER): Payer: Self-pay | Admitting: Orthopedic Surgery

## 2021-01-03 ENCOUNTER — Other Ambulatory Visit: Payer: Self-pay

## 2021-01-07 ENCOUNTER — Encounter (HOSPITAL_BASED_OUTPATIENT_CLINIC_OR_DEPARTMENT_OTHER)
Admission: RE | Admit: 2021-01-07 | Discharge: 2021-01-07 | Disposition: A | Discharge status: Home / Self Care | Payer: Medicare Other | Source: Ambulatory Visit | Attending: Orthopedic Surgery | Admitting: Orthopedic Surgery

## 2021-01-07 DIAGNOSIS — Z87891 Personal history of nicotine dependence: Secondary | ICD-10-CM | POA: Diagnosis not present

## 2021-01-07 DIAGNOSIS — Z883 Allergy status to other anti-infective agents status: Secondary | ICD-10-CM | POA: Diagnosis not present

## 2021-01-07 DIAGNOSIS — Z888 Allergy status to other drugs, medicaments and biological substances status: Secondary | ICD-10-CM | POA: Diagnosis not present

## 2021-01-07 DIAGNOSIS — M19041 Primary osteoarthritis, right hand: Secondary | ICD-10-CM | POA: Diagnosis not present

## 2021-01-07 DIAGNOSIS — M71341 Other bursal cyst, right hand: Secondary | ICD-10-CM | POA: Diagnosis not present

## 2021-01-07 DIAGNOSIS — Z01812 Encounter for preprocedural laboratory examination: Secondary | ICD-10-CM | POA: Diagnosis not present

## 2021-01-07 DIAGNOSIS — Z79899 Other long term (current) drug therapy: Secondary | ICD-10-CM | POA: Diagnosis not present

## 2021-01-07 LAB — BASIC METABOLIC PANEL
Anion gap: 10 (ref 5–15)
BUN: 10 mg/dL (ref 8–23)
CO2: 28 mmol/L (ref 22–32)
Calcium: 8.7 mg/dL — ABNORMAL LOW (ref 8.9–10.3)
Chloride: 97 mmol/L — ABNORMAL LOW (ref 98–111)
Creatinine, Ser: 0.74 mg/dL (ref 0.44–1.00)
GFR, Estimated: >60 mL/min (ref >60)
Glucose, Bld: 94 mg/dL (ref 70–99)
Potassium: 4.3 mmol/L (ref 3.5–5.1)
Sodium: 135 mmol/L (ref 135–145)

## 2021-01-07 NOTE — Progress Notes (Signed)

## 2021-01-09 ENCOUNTER — Telehealth: Payer: Self-pay | Admitting: Family Medicine

## 2021-01-09 NOTE — Telephone Encounter (Signed)
Jana Hakim from Dr. Levell July office called and Michelle Davidson is scheduled for surgery in two days. Can you please handle this call? [phone]. Drue Dun said you know what's going on.

## 2021-01-11 ENCOUNTER — Other Ambulatory Visit: Payer: Self-pay

## 2021-01-11 ENCOUNTER — Ambulatory Visit (HOSPITAL_BASED_OUTPATIENT_CLINIC_OR_DEPARTMENT_OTHER): Payer: Medicare Other | Admitting: Anesthesiology

## 2021-01-11 ENCOUNTER — Encounter (HOSPITAL_BASED_OUTPATIENT_CLINIC_OR_DEPARTMENT_OTHER)
Admission: RE | Disposition: A | Discharge status: Home / Self Care | Payer: Self-pay | Source: Home / Self Care | Attending: Orthopedic Surgery

## 2021-01-11 ENCOUNTER — Encounter (HOSPITAL_BASED_OUTPATIENT_CLINIC_OR_DEPARTMENT_OTHER): Payer: Self-pay | Admitting: Orthopedic Surgery

## 2021-01-11 ENCOUNTER — Ambulatory Visit (HOSPITAL_BASED_OUTPATIENT_CLINIC_OR_DEPARTMENT_OTHER)
Admission: RE | Admit: 2021-01-11 | Discharge: 2021-01-11 | Disposition: A | Discharge status: Home / Self Care | Payer: Medicare Other | Attending: Orthopedic Surgery | Admitting: Orthopedic Surgery

## 2021-01-11 DIAGNOSIS — Z888 Allergy status to other drugs, medicaments and biological substances status: Secondary | ICD-10-CM | POA: Diagnosis not present

## 2021-01-11 DIAGNOSIS — Z79899 Other long term (current) drug therapy: Secondary | ICD-10-CM | POA: Diagnosis not present

## 2021-01-11 DIAGNOSIS — Z87891 Personal history of nicotine dependence: Secondary | ICD-10-CM | POA: Diagnosis not present

## 2021-01-11 DIAGNOSIS — M19041 Primary osteoarthritis, right hand: Secondary | ICD-10-CM | POA: Diagnosis not present

## 2021-01-11 DIAGNOSIS — M151 Heberden's nodes (with arthropathy): Secondary | ICD-10-CM | POA: Diagnosis not present

## 2021-01-11 DIAGNOSIS — E785 Hyperlipidemia, unspecified: Secondary | ICD-10-CM | POA: Diagnosis not present

## 2021-01-11 DIAGNOSIS — Z883 Allergy status to other anti-infective agents status: Secondary | ICD-10-CM | POA: Insufficient documentation

## 2021-01-11 DIAGNOSIS — M71341 Other bursal cyst, right hand: Secondary | ICD-10-CM | POA: Insufficient documentation

## 2021-01-11 DIAGNOSIS — M67441 Ganglion, right hand: Secondary | ICD-10-CM | POA: Diagnosis present

## 2021-01-11 DIAGNOSIS — I10 Essential (primary) hypertension: Secondary | ICD-10-CM | POA: Diagnosis not present

## 2021-01-11 HISTORY — PX: EXCISION METACARPAL MASS: SHX6372

## 2021-01-11 SURGERY — EXCISION METACARPAL MASS
Anesthesia: Monitor Anesthesia Care | Site: Hand | Laterality: Right

## 2021-01-11 MED ORDER — AMISULPRIDE (ANTIEMETIC) 5 MG/2ML IV SOLN: 10.0000 mg | Freq: Once | INTRAVENOUS | Status: DC | PRN

## 2021-01-11 MED ORDER — PROPOFOL 10 MG/ML IV BOLUS
INTRAVENOUS | Status: DC | PRN
  Administered 2021-01-11: 20 mg via INTRAVENOUS

## 2021-01-11 MED ORDER — 0.9 % SODIUM CHLORIDE (POUR BTL) OPTIME
TOPICAL | Status: DC | PRN
  Administered 2021-01-11: 50 mL

## 2021-01-11 MED ORDER — FENTANYL CITRATE (PF) 100 MCG/2ML IJ SOLN
INTRAMUSCULAR | Status: DC | PRN
  Administered 2021-01-11 (×2): 50 ug via INTRAVENOUS

## 2021-01-11 MED ORDER — LIDOCAINE HCL (PF) 0.5 % IJ SOLN
INTRAMUSCULAR | Status: DC | PRN
  Administered 2021-01-11: 25 mL via INTRAVENOUS

## 2021-01-11 MED ORDER — BUPIVACAINE HCL (PF) 0.25 % IJ SOLN
INTRAMUSCULAR | Status: DC | PRN
  Administered 2021-01-11: 7 mL

## 2021-01-11 MED ORDER — MIDAZOLAM HCL 5 MG/5ML IJ SOLN
INTRAMUSCULAR | Status: DC | PRN
  Administered 2021-01-11: 2 mg via INTRAVENOUS

## 2021-01-11 MED ORDER — FENTANYL CITRATE (PF) 100 MCG/2ML IJ SOLN
INTRAMUSCULAR | Status: AC
  Filled 2021-01-11: qty 2

## 2021-01-11 MED ORDER — LACTATED RINGERS IV SOLN: INTRAVENOUS | Status: DC

## 2021-01-11 MED ORDER — ONDANSETRON HCL 4 MG/2ML IJ SOLN
INTRAMUSCULAR | Status: DC | PRN
  Administered 2021-01-11: 4 mg via INTRAVENOUS

## 2021-01-11 MED ORDER — OXYCODONE HCL 5 MG PO TABS: 5.0000 mg | ORAL_TABLET | Freq: Once | ORAL | Status: DC | PRN

## 2021-01-11 MED ORDER — CEFAZOLIN SODIUM-DEXTROSE 2-4 GM/100ML-% IV SOLN
INTRAVENOUS | Status: AC
  Filled 2021-01-11: qty 100

## 2021-01-11 MED ORDER — MIDAZOLAM HCL 2 MG/2ML IJ SOLN
INTRAMUSCULAR | Status: AC
  Filled 2021-01-11: qty 2

## 2021-01-11 MED ORDER — CEFAZOLIN SODIUM-DEXTROSE 2-4 GM/100ML-% IV SOLN
2.0000 g | INTRAVENOUS | Status: AC
  Administered 2021-01-11: 2 g via INTRAVENOUS

## 2021-01-11 MED ORDER — ACETAMINOPHEN 10 MG/ML IV SOLN: 1000.0000 mg | Freq: Once | INTRAVENOUS | Status: DC | PRN

## 2021-01-11 MED ORDER — PROPOFOL 500 MG/50ML IV EMUL
INTRAVENOUS | Status: DC | PRN
  Administered 2021-01-11: 50 ug/kg/min via INTRAVENOUS

## 2021-01-11 MED ORDER — HYDROCODONE-ACETAMINOPHEN 5-325 MG PO TABS: ORAL_TABLET | ORAL | 0 refills | Status: DC

## 2021-01-11 MED ORDER — OXYCODONE HCL 5 MG/5ML PO SOLN: 5.0000 mg | Freq: Once | ORAL | Status: DC | PRN

## 2021-01-11 SURGICAL SUPPLY — 49 items
APL PRP STRL LF DISP 70% ISPRP (MISCELLANEOUS) ×2
APL SKNCLS STERI-STRIP NONHPOA (GAUZE/BANDAGES/DRESSINGS)
BENZOIN TINCTURE PRP APPL 2/3 (GAUZE/BANDAGES/DRESSINGS) IMPLANT
BLADE MINI RND TIP GREEN BEAV (BLADE) IMPLANT
BLADE SURG 15 STRL LF DISP TIS (BLADE) ×4 IMPLANT
BLADE SURG 15 STRL SS (BLADE) ×6
BNDG CMPR 9X4 STRL LF SNTH (GAUZE/BANDAGES/DRESSINGS)
BNDG COHESIVE 1X5 TAN STRL LF (GAUZE/BANDAGES/DRESSINGS) IMPLANT
BNDG COHESIVE 2X5 TAN ST LF (GAUZE/BANDAGES/DRESSINGS) IMPLANT
BNDG CONFORM 2 STRL LF (GAUZE/BANDAGES/DRESSINGS) IMPLANT
BNDG ELASTIC 2X5.8 VLCR STR LF (GAUZE/BANDAGES/DRESSINGS) IMPLANT
BNDG ELASTIC 3X5.8 VLCR STR LF (GAUZE/BANDAGES/DRESSINGS) IMPLANT
BNDG ESMARK 4X9 LF (GAUZE/BANDAGES/DRESSINGS) IMPLANT
BNDG GAUZE 1X2.1 STRL (MISCELLANEOUS) IMPLANT
BNDG GAUZE ELAST 4 BULKY (GAUZE/BANDAGES/DRESSINGS) IMPLANT
BNDG PLASTER X FAST 3X3 WHT LF (CAST SUPPLIES) IMPLANT
BNDG PLSTR 9X3 FST ST WHT (CAST SUPPLIES)
CHLORAPREP W/TINT 26 (MISCELLANEOUS) ×3 IMPLANT
CORD BIPOLAR FORCEPS 12FT (ELECTRODE) ×3 IMPLANT
COVER BACK TABLE 60X90IN (DRAPES) ×3 IMPLANT
COVER MAYO STAND STRL (DRAPES) ×3 IMPLANT
CUFF TOURN SGL QUICK 18X4 (TOURNIQUET CUFF) ×3 IMPLANT
DRAPE EXTREMITY T 121X128X90 (DISPOSABLE) ×3 IMPLANT
DRAPE SURG 17X23 STRL (DRAPES) ×3 IMPLANT
GAUZE SPONGE 4X4 12PLY STRL (GAUZE/BANDAGES/DRESSINGS) ×3 IMPLANT
GAUZE XEROFORM 1X8 LF (GAUZE/BANDAGES/DRESSINGS) ×3 IMPLANT
GLOVE SRG 8 PF TXTR STRL LF DI (GLOVE) ×2 IMPLANT
GLOVE SURG ENC MOIS LTX SZ7.5 (GLOVE) ×3 IMPLANT
GLOVE SURG UNDER POLY LF SZ8 (GLOVE) ×3
GOWN STRL REUS W/ TWL LRG LVL3 (GOWN DISPOSABLE) ×2 IMPLANT
GOWN STRL REUS W/TWL LRG LVL3 (GOWN DISPOSABLE) ×3
GOWN STRL REUS W/TWL XL LVL3 (GOWN DISPOSABLE) ×3 IMPLANT
NEEDLE HYPO 25X1 1.5 SAFETY (NEEDLE) ×3 IMPLANT
NS IRRIG 1000ML POUR BTL (IV SOLUTION) ×3 IMPLANT
PACK BASIN DAY SURGERY FS (CUSTOM PROCEDURE TRAY) ×3 IMPLANT
PAD CAST 3X4 CTTN HI CHSV (CAST SUPPLIES) IMPLANT
PAD CAST 4YDX4 CTTN HI CHSV (CAST SUPPLIES) IMPLANT
PADDING CAST ABS 4INX4YD NS (CAST SUPPLIES) ×1
PADDING CAST ABS COTTON 4X4 ST (CAST SUPPLIES) ×2 IMPLANT
PADDING CAST COTTON 3X4 STRL (CAST SUPPLIES)
PADDING CAST COTTON 4X4 STRL (CAST SUPPLIES)
STOCKINETTE 4X48 STRL (DRAPES) ×3 IMPLANT
STRIP CLOSURE SKIN 1/2X4 (GAUZE/BANDAGES/DRESSINGS) IMPLANT
SUT ETHILON 3 0 PS 1 (SUTURE) IMPLANT
SUT ETHILON 4 0 PS 2 18 (SUTURE) ×3 IMPLANT
SYR BULB EAR ULCER 3OZ GRN STR (SYRINGE) ×3 IMPLANT
SYR CONTROL 10ML LL (SYRINGE) ×3 IMPLANT
TOWEL GREEN STERILE FF (TOWEL DISPOSABLE) ×6 IMPLANT
UNDERPAD 30X36 HEAVY ABSORB (UNDERPADS AND DIAPERS) ×3 IMPLANT

## 2021-01-11 NOTE — Anesthesia Preprocedure Evaluation (Addendum)
Anesthesia Evaluation  Patient identified by MRN, date of birth, ID band Patient awake    Reviewed: Allergy & Precautions, NPO status , Patient's Chart, lab work & pertinent test results  Airway Mallampati: II  TM Distance: >3 FB Neck ROM: Full    Dental  (+) Teeth Intact   Pulmonary neg pulmonary ROS, former smoker,    Pulmonary exam normal        Cardiovascular hypertension, Pt. on medications and Pt. on home beta blockers  Rhythm:Regular Rate:Normal     Neuro/Psych  Headaches, negative psych ROS   GI/Hepatic Neg liver ROS, GERD  ,  Endo/Other  negative endocrine ROS  Renal/GU negative Renal ROS  negative genitourinary   Musculoskeletal  (+) Arthritis , Fibromyalgia -Right finger mucoid cyst    Abdominal (+)  Abdomen: soft.    Peds  Hematology negative hematology ROS (+)   Anesthesia Other Findings   Reproductive/Obstetrics                             Anesthesia Physical Anesthesia Plan  ASA: 2  Anesthesia Plan: Bier Block and MAC and Bier Block-LIDOCAINE ONLY   Post-op Pain Management:    Induction: Intravenous  PONV Risk Score and Plan: 2 and Treatment may vary due to age or medical condition, Ondansetron, Dexamethasone and Midazolam  Airway Management Planned: Simple Face Mask, Natural Airway and Nasal Cannula  Additional Equipment: None  Intra-op Plan:   Post-operative Plan:   Informed Consent: I have reviewed the patients History and Physical, chart, labs and discussed the procedure including the risks, benefits and alternatives for the proposed anesthesia with the patient or authorized representative who has indicated his/her understanding and acceptance.     Dental advisory given  Plan Discussed with:   Anesthesia Plan Comments:         Anesthesia Quick Evaluation

## 2021-01-11 NOTE — Discharge Instructions (Addendum)

## 2021-01-11 NOTE — Anesthesia Procedure Notes (Signed)
Anesthesia Regional Block: Bier block (IV Regional)   Pre-Anesthetic Checklist: , timeout performed,  Correct Patient, Correct Site, Correct Laterality,  Correct Procedure, Correct Position, site marked,  Risks and benefits discussed,  Surgical consent,  Pre-op evaluation  Laterality: Right  Prep: alcohol swabs        Procedures:,,,,, intact distal pulses, Esmarch exsanguination    Narrative:  Start time: 01/11/2021 1:48 PM End time: 01/11/2021 1:49 PM Injection made incrementally with aspirations every 30 mL. CRNA: Glory Buff, CRNA

## 2021-01-11 NOTE — H&P (Signed)
Michelle Davidson is an 75 y.o. female.   Chief Complaint: mucoid cyst HPI: 75 yo female with mucoid cyst right long finger.  It is bothersome to her.  She wishes to have mucoid cyst excision and debridement of dip joint.  Allergies:  Allergies  Allergen Reactions   Esomeprazole Magnesium     Syncope   Lipitor [Atorvastatin Calcium]     Muscle aches   Zocor [Simvastatin]     Muscle aches   Nexium [Esomeprazole Magnesium]    Avelox [Moxifloxacin Hcl In Nacl] Rash    Past Medical History:  Diagnosis Date   Arthritis    Basal cell carcinoma    Choroidal neovascularization    Right eye   CNVM (choroidal neovascular membrane), right    Fibromyalgia    History of chicken pox    History of colon polyps    Hyperlipidemia    Hypertension    Migraines    PAC (premature atrial contraction)    PVC (premature ventricular contraction)     Past Surgical History:  Procedure Laterality Date   BELPHAROPTOSIS REPAIR Right 2010   BREAST BIOPSY Left 2011   Titanium chip implanted to mark the spot/benign findings   EYE SURGERY Bilateral 03/28/2018   cataract removal with lens implants. Dr.Beavis   TUBAL LIGATION  1978   WISDOM TOOTH EXTRACTION      Family History: Family History  Problem Relation Age of Onset   Arthritis Mother    Hyperlipidemia Mother    Hypertension Mother    Colon cancer Father    Arthritis Maternal Grandmother    Heart attack Maternal Grandfather    Lung cancer Maternal Grandfather        Smoker   Diabetes Paternal Grandmother    Uterine cancer Paternal Grandmother    Skin cancer Paternal Grandfather    Colon cancer Sister        mets to liver Dec 26, 2018   Diabetes Paternal Uncle    Kidney cancer Paternal Uncle    Breast cancer Cousin    Stomach cancer Neg Hx    Esophageal cancer Neg Hx     Social History:   reports that she quit smoking about 25 years ago. Her smoking use included cigarettes. She has never used smokeless tobacco. She reports  current alcohol use. She reports that she does not use drugs.  Medications: Medications Prior to Admission  Medication Sig Dispense Refill   amitriptyline (ELAVIL) 25 MG tablet TAKE 1 TABLET BY MOUTH EVERYDAY AT BEDTIME 90 tablet 3   atenolol (TENORMIN) 25 MG tablet Take 1 tablet (25 mg total) by mouth 2 (two) times daily. 180 tablet 3   fluticasone (FLONASE) 50 MCG/ACT nasal spray Place 1 spray into both nostrils daily. 16 mL 11   hydrochlorothiazide (MICROZIDE) 12.5 MG capsule Take 1 capsule (12.5 mg total) by mouth daily. 90 capsule 3   hyoscyamine (LEVSIN SL) 0.125 MG SL tablet Place 1 tablet (0.125 mg total) under the tongue every 4 (four) hours as needed. 30 tablet 0   Multiple Vitamins-Minerals (CENTRUM SILVER 50+WOMEN PO) Take 1 tablet by mouth daily.     Turmeric Curcumin 500 MG CAPS       No results found for this or any previous visit (from the past 71 hour(s)).  No results found.     Height 5' 2.5" (1.588 m), weight 68 kg.  General appearance: alert, cooperative, and appears stated age Head: Normocephalic, without obvious abnormality, atraumatic Neck: supple, symmetrical, trachea midline  Cardio: regular rate and rhythm Resp: clear to auscultation bilaterally Extremities: Intact sensation and capillary refill all digits.  +epl/fpl/io.  No wounds.  Pulses: 2+ and symmetric Skin: Skin color, texture, turgor normal. No rashes or lesions Neurologic: Grossly normal Incision/Wound: none  Assessment/Plan Right long finger mucoid cyst.  Non operative and operative treatment options have been discussed with the patient and patient wishes to proceed with operative treatment. Risks, benefits, and alternatives of surgery have been discussed and the patient agrees with the plan of care.   Leanora Cover 01/11/2021, 12:08 PM

## 2021-01-11 NOTE — Transfer of Care (Signed)
Immediate Anesthesia Transfer of Care Note  Patient: Michelle Davidson  Procedure(s) Performed: RIGHT LONG FINGER EXCISION CYST AND DEBRIDEMENT DISTAL INTERPHALANGEAL JOINT (Right) POSSIBLE ROTATION FLAP (Right)  Patient Location: PACU  Anesthesia Type:Bier block  Level of Consciousness: awake, alert  and oriented  Airway & Oxygen Therapy: Patient Spontanous Breathing and Patient connected to face mask oxygen  Post-op Assessment: Report given to RN and Post -op Vital signs reviewed and stable  Post vital signs: Reviewed and stable  Last Vitals:  Vitals Value Taken Time  BP    Temp    Pulse    Resp    SpO2      Last Pain:  Vitals:   01/11/21 1225  TempSrc: Oral  PainSc: 0-No pain         Complications: No notable events documented.

## 2021-01-11 NOTE — Op Note (Signed)
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  Right long finger excision of mucoid cyst 2.  Right long finger debridement of DIP joint including osteophyte from distal phalanx and middle phalanx   SURGEON:  Leanora Cover, M.D.   ASSISTANT: none   ANESTHESIA:  Bier block with sedation   INTRAVENOUS FLUIDS:  Per anesthesia flow sheet.   ESTIMATED BLOOD LOSS:  Minimal.   COMPLICATIONS:  None.   SPECIMENS: Mucoid cyst to pathology   TOURNIQUET TIME:    Total Tourniquet Time Documented: Forearm (Right) - 24 minutes Total: Forearm (Right) - 24 minutes    DISPOSITION:  Stable to PACU.   INDICATIONS: 75 year old female who is noted a small cyst on the right long finger distal to the DIP joint.  These are bothersome to her.  She wishes to have them removed and the DIP joint debrided to try to prevent recurrence.  Risks, benefits and alternatives of surgery were discussed including the risks of blood loss, infection, damage to nerves, vessels, tendons, ligaments, bone for surgery, need for additional surgery, complications with wound healing, continued pain, stiffness.  She voiced understanding of these risks and elected to proceed.  OPERATIVE COURSE:  After being identified preoperatively by myself,  the patient and I agreed on the procedure and site of the procedure.  The surgical site was marked.  Surgical consent had been signed. She was given IV antibiotics as preoperative antibiotic prophylaxis. She was transferred to the operating room and placed on the operating table in supine position with the right upper extremity on an arm board.   Bier block anesthesia was induced by the anesthesiologist.right upper extremity was prepped and draped in normal sterile orthopedic fashion.  A surgical pause was performed between the surgeons, anesthesia, and operating room staff and all were in agreement as to the patient, procedure, and site of procedure.  Tourniquet at the proximal aspect of the forearm had been inflated for the Bier block.  A hockey-stick shaped incision was made at the dorsum of the DIP joint of the long finger.  This is carried into subcutaneous tissues by spreading technique.  The cyst was identified in the subcutaneous tissues.  It was carefully freed up and removed with the rongeurs.  There is noted to be prominence of the distal phalanx of the radial side.  This was debrided down using a rondure.  The DIP joint was entered underneath the extensor tendon which was protected throughout the case.  There was intra-articular ganglion which was also removed.  There was osteophyte at the dorsum of the middle phalanx.  This was removed with the rondure.  The wound and joint were then copiously irrigated with sterile saline.  The wound was closed with 4-0 nylon in a horizontal mattress fashion.  Digital block was performed with quarter percent plain Marcaine to aid in postoperative analgesia.  The wound was dressed with sterile Xeroform 4 x 4 and wrapped with a Coban dressing lightly.  An AlumaFoam splint was placed and wrapped lightly with Coban dressing.  The tourniquet was deflated at 24 minutes.  Fingertips were pink with brisk capillary refill after deflation of tourniquet.  The  operative  drapes were broken down.  The patient was awoken from anesthesia safely.  She was transferred back to the stretcher and taken to PACU in stable condition.  I will see her back in the office in 1 week for postoperative followup.  I will give her a prescription for Norco 5/325 1 tab PO q6 hours prn pain, dispense # 20.   Leanora Cover, MD Electronically  signed, 01/11/21

## 2021-01-11 NOTE — Anesthesia Postprocedure Evaluation (Signed)
Anesthesia Post Note  Patient: Michelle Davidson  Procedure(s) Performed: RIGHT LONG FINGER EXCISION CYST AND DEBRIDEMENT DISTAL INTERPHALANGEAL JOINT (Right: Hand)     Patient location during evaluation: PACU Anesthesia Type: MAC and Bier Block Level of consciousness: awake and alert Pain management: pain level controlled Vital Signs Assessment: post-procedure vital signs reviewed and stable Respiratory status: spontaneous breathing, nonlabored ventilation, respiratory function stable and patient connected to nasal cannula oxygen Cardiovascular status: stable and blood pressure returned to baseline Postop Assessment: no apparent nausea or vomiting Anesthetic complications: no   No notable events documented.  Last Vitals:  Vitals:   01/11/21 1430 01/11/21 1452  BP: 140/65 (!) 152/63  Pulse: 60 63  Resp: 15 16  Temp:  36.6 C  SpO2: 96% 93%    Last Pain:  Vitals:   01/11/21 1452  TempSrc:   PainSc: 0-No pain                 Belenda Cruise P Corin Tilly

## 2021-01-11 NOTE — Anesthesia Procedure Notes (Signed)
Procedure Name: MAC Date/Time: 01/11/2021 1:37 PM Performed by: Glory Buff, CRNA Oxygen Delivery Method: Simple face mask

## 2021-01-14 ENCOUNTER — Encounter (HOSPITAL_BASED_OUTPATIENT_CLINIC_OR_DEPARTMENT_OTHER): Payer: Self-pay | Admitting: Orthopedic Surgery

## 2021-01-14 LAB — SURGICAL PATHOLOGY

## 2021-01-29 ENCOUNTER — Ambulatory Visit: Payer: Medicare Other

## 2021-01-31 ENCOUNTER — Encounter: Payer: Self-pay | Admitting: Family Medicine

## 2021-01-31 ENCOUNTER — Ambulatory Visit: Payer: Medicare Other

## 2021-01-31 DIAGNOSIS — Z23 Encounter for immunization: Secondary | ICD-10-CM | POA: Diagnosis not present

## 2021-02-06 DIAGNOSIS — H43811 Vitreous degeneration, right eye: Secondary | ICD-10-CM | POA: Diagnosis not present

## 2021-02-06 DIAGNOSIS — H43823 Vitreomacular adhesion, bilateral: Secondary | ICD-10-CM | POA: Diagnosis not present

## 2021-02-06 DIAGNOSIS — H4423 Degenerative myopia, bilateral: Secondary | ICD-10-CM | POA: Diagnosis not present

## 2021-02-06 DIAGNOSIS — H43393 Other vitreous opacities, bilateral: Secondary | ICD-10-CM | POA: Diagnosis not present

## 2021-03-06 ENCOUNTER — Ambulatory Visit: Payer: Medicare Other

## 2021-03-06 ENCOUNTER — Telehealth: Payer: Self-pay

## 2021-03-06 NOTE — Progress Notes (Unsigned)
Erroneous  encounter patient does not have a PCP [facility]

## 2021-03-06 NOTE — Telephone Encounter (Signed)
Pt called stating med refills denied. Asked if she would be finding provider with [facility]. She said she was told she had to as [facility] had no options for her to transfer. I advised pt of alternate providers at [facility]. She is out of meds. I scheduled med refill only appt 11/11 with Dr. Ethelene Hal.

## 2021-03-06 NOTE — Telephone Encounter (Signed)
Patient does not have a PCP provider at [facility] and is currently moving to [facility].  Per patient she will send medical record request as soon as she schedules appointment with new PCP L.Livie Vanderhoof,LPN

## 2021-03-08 ENCOUNTER — Ambulatory Visit (INDEPENDENT_AMBULATORY_CARE_PROVIDER_SITE_OTHER): Payer: Medicare Other | Admitting: Family Medicine

## 2021-03-08 ENCOUNTER — Encounter: Payer: Self-pay | Admitting: Family Medicine

## 2021-03-08 ENCOUNTER — Other Ambulatory Visit: Payer: Self-pay

## 2021-03-08 VITALS — BP 162/82 | HR 68 | Temp 98.6°F | Ht 62.0 in | Wt 156.0 lb

## 2021-03-08 DIAGNOSIS — S335XXA Sprain of ligaments of lumbar spine, initial encounter: Secondary | ICD-10-CM

## 2021-03-08 DIAGNOSIS — I10 Essential (primary) hypertension: Secondary | ICD-10-CM | POA: Diagnosis not present

## 2021-03-08 MED ORDER — HYDROCHLOROTHIAZIDE 25 MG PO TABS: 25.0000 mg | ORAL_TABLET | Freq: Every day | ORAL | 3 refills | Status: DC

## 2021-03-08 MED ORDER — ATENOLOL 25 MG PO TABS: 25.0000 mg | ORAL_TABLET | Freq: Two times a day (BID) | ORAL | 3 refills | Status: DC

## 2021-03-08 MED ORDER — METHOCARBAMOL 500 MG PO TABS: 500.0000 mg | ORAL_TABLET | Freq: Three times a day (TID) | ORAL | 0 refills | Status: DC | PRN

## 2021-03-08 NOTE — Progress Notes (Signed)
Established Patient Office Visit  Subjective:  Patient ID: Michelle Davidson, female    DOB: 1946/04/10  Age: 75 y.o. MRN: [id number]  CC:  Chief Complaint  Patient presents with   Transitions Of Care    TOC from Dr. Bryan Lemma, would like refills on medications.     HPI Michelle Davidson presents for follow-up of her blood pressure.  She has taken Tenormin 25 mg twice daily and low-dose HCTZ for years with okay control.  More recently her blood pressure has been running more in the 140s over 80s.  Has not checked it in a while.  She hurt her back a few weeks ago bending over in the shower.  Back pain is nonradiating and located mostly on the right.  There is no weakness numbness or tingling no change in bowel or bladder function.  She has a history of arthritis in her lower back.  She does feel muscle spasms.  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  She is taking Zomig in the past for these but has not needed it in some time.    Past Medical History:  Diagnosis Date   Arthritis    Basal cell carcinoma    Choroidal neovascularization    Right eye   CNVM (choroidal neovascular membrane), right    Fibromyalgia    History of chicken pox    History of colon polyps    Hyperlipidemia    Hypertension    Migraines    PAC (premature atrial contraction)    PVC (premature ventricular contraction)     Past Surgical History:  Procedure Laterality Date   BELPHAROPTOSIS REPAIR Right 2010   BREAST BIOPSY Left 2011   Titanium chip implanted to mark the spot/benign findings   EXCISION METACARPAL MASS Right 01/11/2021   Procedure: RIGHT LONG FINGER EXCISION CYST AND DEBRIDEMENT DISTAL INTERPHALANGEAL JOINT;  Surgeon: Leanora Cover, MD;  Location: [place];  Service: Orthopedics;  Laterality: Right;  30 MIN   EYE SURGERY Bilateral 03/28/2018   cataract removal with lens implants. Dr.Beavis   TUBAL LIGATION  1978    WISDOM TOOTH EXTRACTION      Family History  Problem Relation Age of Onset   Arthritis Mother    Hyperlipidemia Mother    Hypertension Mother    Colon cancer Father    Arthritis Maternal Grandmother    Heart attack Maternal Grandfather    Lung cancer Maternal Grandfather        Smoker   Diabetes Paternal Grandmother    Uterine cancer Paternal Grandmother    Skin cancer Paternal Grandfather    Colon cancer Sister        mets to liver Dec 26, 2018   Diabetes Paternal Uncle    Kidney cancer Paternal Uncle    Breast cancer Cousin    Stomach cancer Neg Hx    Esophageal cancer Neg Hx     Social History   Socioeconomic History   Marital status: Widowed    Spouse name: Not on file   Number of children: 2   Years of education: Not on file   Highest education level: Not on file  Occupational History   Occupation: retired  Tobacco Use   Smoking status: Former    Types: Cigarettes    Quit date: 1997    Years since quitting: 25.8   Smokeless tobacco: Never  Vaping Use   Vaping Use: Never used  Substance and Sexual Activity   Alcohol use: Yes    Comment: cocktail every night   Drug use: No   Sexual activity: Not Currently    Partners: Male    Comment: 1st intercourse- 59, partners- 89, widow  Other Topics Concern   Not on file  Social History Narrative   Not on file   Social Determinants of Health   Financial Resource Strain: Not on file  Food Insecurity: Not on file  Transportation Needs: Not on file  Physical Activity: Not on file  Stress: Not on file  Social Connections: Not on file  Intimate Partner Violence: Not on file    Outpatient Medications Prior to Visit  Medication Sig Dispense Refill   amitriptyline (ELAVIL) 25 MG tablet TAKE 1 TABLET BY MOUTH EVERYDAY AT BEDTIME 90 tablet 3   fluticasone (FLONASE) 50 MCG/ACT nasal spray Place 1 spray into both nostrils daily. 16 mL 11   hyoscyamine (LEVSIN SL) 0.125 MG SL tablet Place 1 tablet (0.125 mg total)  under the tongue every 4 (four) hours as needed. 30 tablet 0   Multiple Vitamins-Minerals (CENTRUM SILVER 50+WOMEN PO) Take 1 tablet by mouth daily.     atenolol (TENORMIN) 25 MG tablet Take 1 tablet (25 mg total) by mouth 2 (two) times daily. 180 tablet 3   hydrochlorothiazide (MICROZIDE) 12.5 MG capsule Take 1 capsule (12.5 mg total) by mouth daily. 90 capsule 3   Turmeric Curcumin 500 MG CAPS  (Patient not taking: Reported on 03/08/2021)     HYDROcodone-acetaminophen (NORCO) 5-325 MG tablet 1 tab po q6 hours prn pain 20 tablet 0   No facility-administered medications prior to visit.    Allergies  Allergen Reactions   Esomeprazole Magnesium     Syncope   Lipitor [Atorvastatin Calcium]     Muscle aches   Zocor [Simvastatin]     Muscle aches   Nexium [Esomeprazole Magnesium]    Avelox [Moxifloxacin Hcl In Nacl] Rash    ROS Review of Systems  Constitutional: Negative.   HENT: Negative.    Respiratory: Negative.    Cardiovascular: Negative.   Gastrointestinal: Negative.   Genitourinary: Negative.   Musculoskeletal:  Positive for back pain and myalgias.  Neurological:  Negative for dizziness, speech difficulty, weakness, light-headedness, numbness and headaches.     Objective:    Physical Exam Vitals and nursing note reviewed.  Constitutional:      General: She is not in acute distress.    Appearance: Normal appearance. She is normal weight. She is not ill-appearing, toxic-appearing or diaphoretic.  HENT:     Head: Normocephalic and atraumatic.     Right Ear: Tympanic membrane, ear canal and external ear normal.     Left Ear: Tympanic membrane, ear canal and external ear normal.     Mouth/Throat:     Mouth: Mucous membranes are moist.     Pharynx: Oropharynx is clear. No oropharyngeal exudate or posterior oropharyngeal erythema.  Eyes:     General: No scleral icterus.       Right eye: No discharge.        Left eye: No discharge.     Extraocular Movements: Extraocular  movements intact.     Conjunctiva/sclera: Conjunctivae normal.     Pupils: Pupils are equal, round, and reactive to light.  Neck:     Vascular: No carotid bruit.  Cardiovascular:     Rate and Rhythm: Normal rate and regular rhythm.  Pulmonary:     Effort: Pulmonary effort is normal.  Breath sounds: Normal breath sounds.  Abdominal:     General: Bowel sounds are normal.  Musculoskeletal:     Cervical back: No rigidity or tenderness.     Right lower leg: No edema.     Left lower leg: No edema.  Lymphadenopathy:     Cervical: No cervical adenopathy.  Skin:    General: Skin is warm and dry.  Neurological:     Mental Status: She is alert and oriented to person, place, and time.    BP (!) 162/82 (BP Location: Left Arm, Patient Position: Sitting, Cuff Size: Normal)   Pulse 68   Temp 98.6 F (37 C) (Temporal)   Ht 5\' 2"  (1.575 m)   Wt 156 lb (70.8 kg)   SpO2 95%   BMI 28.53 kg/m  Wt Readings from Last 3 Encounters:  03/08/21 156 lb (70.8 kg)  01/11/21 154 lb 5.2 oz (70 kg)  11/05/20 154 lb (69.9 kg)     Health Maintenance Due  Topic Date Due   Pneumonia Vaccine 63+ Years old (3) 07/28/2014   COLONOSCOPY (Pts 45-56yrs Insurance coverage will need to be confirmed)  08/05/2020    There are no preventive care reminders to display for this patient.  Lab Results  Component Value Date   TSH 0.93 06/09/2018   Lab Results  Component Value Date   WBC 6.6 01/19/2020   HGB 13.2 01/19/2020   HCT 38.6 01/19/2020   MCV 91.4 01/19/2020   PLT 296.0 01/19/2020   Lab Results  Component Value Date   NA 135 01/07/2021   K 4.3 01/07/2021   CO2 28 01/07/2021   GLUCOSE 94 01/07/2021   BUN 10 01/07/2021   CREATININE 0.74 01/07/2021   BILITOT 0.7 01/19/2020   ALKPHOS 62 01/19/2020   AST 18 01/19/2020   ALT 17 01/19/2020   PROT 7.1 01/19/2020   ALBUMIN 4.4 01/19/2020   CALCIUM 8.7 (L) 01/07/2021   ANIONGAP 10 01/07/2021   GFR 77.61 01/19/2020   Lab Results  Component  Value Date   CHOL 230 (H) 01/19/2020   Lab Results  Component Value Date   HDL 71.40 01/19/2020   Lab Results  Component Value Date   LDLCALC 137 (H) 01/19/2020   Lab Results  Component Value Date   TRIG 108.0 01/19/2020   Lab Results  Component Value Date   CHOLHDL 3 01/19/2020   Lab Results  Component Value Date   HGBA1C 6.0 01/19/2020      Assessment & Plan:   Problem List Items Addressed This Visit       Cardiovascular and Mediastinum   Essential hypertension   Relevant Medications   atenolol (TENORMIN) 25 MG tablet   hydrochlorothiazide (HYDRODIURIL) 25 MG tablet     Musculoskeletal and Integument   Lumbar sprain - Primary   Relevant Medications   methocarbamol (ROBAXIN) 500 MG tablet    Meds ordered this encounter  Medications   atenolol (TENORMIN) 25 MG tablet    Sig: Take 1 tablet (25 mg total) by mouth 2 (two) times daily.    Dispense:  180 tablet    Refill:  3   methocarbamol (ROBAXIN) 500 MG tablet    Sig: Take 1 tablet (500 mg total) by mouth every 8 (eight) hours as needed for muscle spasms.    Dispense:  30 tablet    Refill:  0   hydrochlorothiazide (HYDRODIURIL) 25 MG tablet    Sig: Take 1 tablet (25 mg total) by mouth daily.  Dispense:  90 tablet    Refill:  3    Follow-up: Return in about 3 months (around 06/08/2021), or if symptoms worsen or fail to improve.   Have increased HCTZ to 25 mg.  Patient will check and record her blood pressures a couple of times a week for the next few months.  Her goal is less than 140 over less than 90.  I rechecked her pressure 156/82.  She was given information on managing hypertension as well as the prevention of back injuries.  She will use Robaxin as needed for spasms.  BMP drawn for recent surgical procedure was essentially normal.  Potassium was normal. Libby Maw, MD

## 2021-03-18 ENCOUNTER — Telehealth: Payer: Self-pay | Admitting: Family Medicine

## 2021-03-18 NOTE — Telephone Encounter (Signed)
Pt dropped off form for provider to sign, I put in Dr Avel Peace folder up front

## 2021-03-19 NOTE — Telephone Encounter (Signed)
Form received waiting to be filled out and signed. Then it will be faxed

## 2021-03-27 DIAGNOSIS — Z961 Presence of intraocular lens: Secondary | ICD-10-CM | POA: Diagnosis not present

## 2021-03-27 DIAGNOSIS — H524 Presbyopia: Secondary | ICD-10-CM | POA: Diagnosis not present

## 2021-03-27 DIAGNOSIS — H04123 Dry eye syndrome of bilateral lacrimal glands: Secondary | ICD-10-CM | POA: Diagnosis not present

## 2021-03-27 DIAGNOSIS — H35372 Puckering of macula, left eye: Secondary | ICD-10-CM | POA: Diagnosis not present

## 2021-04-08 ENCOUNTER — Other Ambulatory Visit: Payer: Self-pay | Admitting: Family Medicine

## 2021-04-12 ENCOUNTER — Other Ambulatory Visit: Payer: Self-pay

## 2021-04-12 ENCOUNTER — Other Ambulatory Visit (INDEPENDENT_AMBULATORY_CARE_PROVIDER_SITE_OTHER): Payer: Medicare Other

## 2021-04-12 ENCOUNTER — Telehealth: Payer: Self-pay | Admitting: Gastroenterology

## 2021-04-12 DIAGNOSIS — K625 Hemorrhage of anus and rectum: Secondary | ICD-10-CM

## 2021-04-12 LAB — CBC WITH DIFFERENTIAL/PLATELET
Basophils Absolute: 0 10*3/uL (ref 0.0–0.1)
Basophils Relative: 0.6 % (ref 0.0–3.0)
Eosinophils Absolute: 0.1 10*3/uL (ref 0.0–0.7)
Eosinophils Relative: 1.9 % (ref 0.0–5.0)
HCT: 39.1 % (ref 36.0–46.0)
Hemoglobin: 13.3 g/dL (ref 12.0–15.0)
Lymphocytes Relative: 26.5 % (ref 12.0–46.0)
Lymphs Abs: 1.8 10*3/uL (ref 0.7–4.0)
MCHC: 33.9 g/dL (ref 30.0–36.0)
MCV: 90.8 fl (ref 78.0–100.0)
Monocytes Absolute: 0.6 10*3/uL (ref 0.1–1.0)
Monocytes Relative: 8.2 % (ref 3.0–12.0)
Neutro Abs: 4.3 10*3/uL (ref 1.4–7.7)
Neutrophils Relative %: 62.8 % (ref 43.0–77.0)
Platelets: 311 10*3/uL (ref 150.0–400.0)
RBC: 4.31 Mil/uL (ref 3.87–5.11)
RDW: 12.4 % (ref 11.5–15.5)
WBC: 6.9 10*3/uL (ref 4.0–10.5)

## 2021-04-12 LAB — BASIC METABOLIC PANEL
BUN: 12 mg/dL (ref 6–23)
CO2: 30 mEq/L (ref 19–32)
Calcium: 10 mg/dL (ref 8.4–10.5)
Chloride: 92 mEq/L — ABNORMAL LOW (ref 96–112)
Creatinine, Ser: 0.87 mg/dL (ref 0.40–1.20)
GFR: 64.57 mL/min (ref >60.00)
Glucose, Bld: 131 mg/dL — ABNORMAL HIGH (ref 70–99)
Potassium: 3.8 mEq/L (ref 3.5–5.1)
Sodium: 132 mEq/L — ABNORMAL LOW (ref 135–145)

## 2021-04-12 NOTE — Telephone Encounter (Signed)
Patient with IBS-D last seen by Alonza Bogus, PA. She reports she had another of her infrequent "bowel sessions" where she wakes up in the night with abdominal pain, nausea and a weak feeling that causes her to rush to the bathroom for a bowel movement. She will take the Levsin, which seems to help some, maybe go a couple of more times, and then it is over. The following day which was yesterday, she went to the bathroom for what she thought would be a bowel movement, and passed blood clots instead. There was not pain. She has not seen blood since then. She denies any rectal discomfort, burning , pressure or itching. Denies any recent NSAID use. I can move her appointment to January, but there are no sooner openings with anyone until then.

## 2021-04-12 NOTE — Telephone Encounter (Signed)
Patient called this morning.  For the last several days she has had severe abdominal pain with her bowel movements and has noticed some BRB in her stools.  She is questioning the possibility of hemorrhoids and would like some advice as to what she needs to do.  She does have an appointment scheduled with Dr. Silverio Decamp on 06/18/21 at 9:30 a.m.  Thank you.

## 2021-04-12 NOTE — Telephone Encounter (Signed)
Her symptoms are concerning for possible diverticular hemorrhage especially with her passing clots of blood.  She will need to come to ER if she has ongoing bleeding.  Please have her come in for stat CBC and BMP this afternoon.  Thank you

## 2021-04-12 NOTE — Telephone Encounter (Signed)
Discussed with the patient. She states no further bleeding. She has not had a bowel movement today. Agrees to come for labs. Ordered labs STAT.

## 2021-05-02 DIAGNOSIS — K589 Irritable bowel syndrome without diarrhea: Secondary | ICD-10-CM | POA: Diagnosis not present

## 2021-05-02 DIAGNOSIS — R7303 Prediabetes: Secondary | ICD-10-CM | POA: Diagnosis not present

## 2021-05-02 DIAGNOSIS — I10 Essential (primary) hypertension: Secondary | ICD-10-CM | POA: Diagnosis not present

## 2021-05-02 DIAGNOSIS — J302 Other seasonal allergic rhinitis: Secondary | ICD-10-CM | POA: Diagnosis not present

## 2021-05-17 DIAGNOSIS — E871 Hypo-osmolality and hyponatremia: Secondary | ICD-10-CM | POA: Diagnosis not present

## 2021-06-13 ENCOUNTER — Ambulatory Visit: Payer: Medicare Other | Admitting: Family Medicine

## 2021-06-17 ENCOUNTER — Encounter: Payer: Self-pay | Admitting: Gastroenterology

## 2021-06-18 ENCOUNTER — Ambulatory Visit: Payer: Medicare Other | Admitting: Gastroenterology

## 2021-06-18 DIAGNOSIS — R059 Cough, unspecified: Secondary | ICD-10-CM | POA: Diagnosis not present

## 2021-06-18 DIAGNOSIS — U071 COVID-19: Secondary | ICD-10-CM | POA: Diagnosis not present

## 2021-06-18 DIAGNOSIS — R509 Fever, unspecified: Secondary | ICD-10-CM | POA: Diagnosis not present

## 2021-08-05 ENCOUNTER — Telehealth: Payer: Self-pay | Admitting: Family Medicine

## 2021-08-05 NOTE — Telephone Encounter (Signed)
I called patient to schedule her AWV.  Patient said she has moved and she has a new PCP-[facility]. Please remove PCP. ?

## 2021-08-07 DIAGNOSIS — H43822 Vitreomacular adhesion, left eye: Secondary | ICD-10-CM | POA: Diagnosis not present

## 2021-08-07 DIAGNOSIS — H442E1 Degenerative myopia with other maculopathy, right eye: Secondary | ICD-10-CM | POA: Diagnosis not present

## 2021-08-07 DIAGNOSIS — H43813 Vitreous degeneration, bilateral: Secondary | ICD-10-CM | POA: Diagnosis not present

## 2021-08-07 DIAGNOSIS — H4422 Degenerative myopia, left eye: Secondary | ICD-10-CM | POA: Diagnosis not present

## 2021-09-10 ENCOUNTER — Ambulatory Visit: Payer: Medicare Other | Admitting: Gastroenterology

## 2021-11-06 ENCOUNTER — Ambulatory Visit: Payer: Medicare Other | Admitting: Obstetrics & Gynecology

## 2021-11-07 ENCOUNTER — Ambulatory Visit: Payer: Medicare Other | Admitting: Gastroenterology

## 2021-11-08 ENCOUNTER — Ambulatory Visit: Payer: Medicare Other | Admitting: Gastroenterology

## 2021-11-13 DIAGNOSIS — R7303 Prediabetes: Secondary | ICD-10-CM | POA: Diagnosis not present

## 2021-11-13 DIAGNOSIS — Z1231 Encounter for screening mammogram for malignant neoplasm of breast: Secondary | ICD-10-CM | POA: Diagnosis not present

## 2021-11-13 DIAGNOSIS — Z23 Encounter for immunization: Secondary | ICD-10-CM | POA: Diagnosis not present

## 2021-11-13 DIAGNOSIS — E871 Hypo-osmolality and hyponatremia: Secondary | ICD-10-CM | POA: Diagnosis not present

## 2021-11-13 DIAGNOSIS — I10 Essential (primary) hypertension: Secondary | ICD-10-CM | POA: Diagnosis not present

## 2021-11-13 DIAGNOSIS — K589 Irritable bowel syndrome without diarrhea: Secondary | ICD-10-CM | POA: Diagnosis not present

## 2021-11-14 NOTE — Progress Notes (Signed)
11/15/2021 PASCUALA KLUTTS [id number] 05-15-1945  Referring provider: No ref. provider found Primary GI doctor: Dr. Silverio Decamp  ASSESSMENT AND PLAN:  Assessment: 1. History of adenomatous polyp of colon   2. Irritable bowel syndrome with diarrhea   3. Diverticulosis of colon   4. Gastroesophageal reflux disease without esophagitis    Family history of colon cancer in father and sister 08/15/2017 tubular adenomatous polyps, diverticulosis narrowing and diverticular spasm, internal hemorrhoid recall 3 years We had a long discussion about the purpose of colon cancer screening . With shared decision making we have decided to proceed with the colonoscopy since patient appears to be in good health at this time.  We have discussed the risks of bleeding, infection, perforation, medication reactions,  and remote risk of death associated with colonoscopy.  All questions were answered and the patient acknowledges these risk and wishes to proceed.  Will call if any symptoms of diverticulosis Add on fiber supplement, avoid NSAIDS, information given  IBS appears to be well controlled right now with amitriptyline, diet.  Given FODMAP diet GERD well-controlled at this time  Plan: Orders Placed This Encounter  Procedures   Ambulatory referral to Gastroenterology     History of Present Illness:  76 y.o. female  with a past medical history of fibromyalgia, migraines IBS predominant diarrhea and others listed below, returns to clinic today for evaluation of possible colonoscopy.  08/15/2017 tubular adenomatous polyps, diverticulosis narrowing and diverticular spasm, internal hemorrhoid recall 3 years 03/02/2019 CT abdomen pelvis with contrast For abdominal pain, distention showed colonic diverticulosis without diverticulitis, uterine fibroid  Patient denies GERD.  Patient denies dysphagia, nausea, vomiting, melena.  Patient has a BM every daily, amitriptyline has helped significantly with  symptoms.  Patient denies change in bowel habits, constipation, diarrhea, hematochezia.  Denies changes in appetite, unintentional weight loss.  Patient reports family history of colon cancer in father in 36's and sister in early 73's    Current Medications:    Current Outpatient Medications (Cardiovascular):    atenolol (TENORMIN) 25 MG tablet, Take 1 tablet (25 mg total) by mouth 2 (two) times daily.   olmesartan (BENICAR) 20 MG tablet, Take 1 tablet by mouth daily.  Current Outpatient Medications (Respiratory):    fluticasone (FLONASE) 50 MCG/ACT nasal spray, SPRAY 1 SPRAY INTO BOTH NOSTRILS DAILY.    Current Outpatient Medications (Other):    amitriptyline (ELAVIL) 25 MG tablet, TAKE 1 TABLET BY MOUTH EVERYDAY AT BEDTIME   hyoscyamine (LEVSIN SL) 0.125 MG SL tablet, Place 1 tablet (0.125 mg total) under the tongue every 4 (four) hours as needed.   Multiple Vitamins-Minerals (CENTRUM SILVER 50+WOMEN PO), Take 1 tablet by mouth daily.  Surgical History:  She  has a past surgical history that includes Breast biopsy (Left, 2011); Blepharoptosis repair (Right, 2010); Wisdom tooth extraction; Tubal ligation (1978); Eye surgery (Bilateral, 03/28/2018); and Excision metacarpal mass (Right, 01/11/2021). Family History:  Her family history includes Arthritis in her maternal grandmother and mother; Breast cancer in her cousin; Colon cancer in her father and sister; Diabetes in her paternal grandmother and paternal uncle; Heart attack in her maternal grandfather; Hyperlipidemia in her mother; Hypertension in her mother; Kidney cancer in her paternal uncle; Lung cancer in her maternal grandfather; Skin cancer in her paternal grandfather; Uterine cancer in her paternal grandmother. Social History:   reports that she quit smoking about 26 years ago. Her smoking use included cigarettes. She has never used smokeless tobacco. She reports current alcohol use. She reports  that she does not use  drugs.  Current Medications, Allergies, Past Medical History, Past Surgical History, Family History and Social History were reviewed in [employer] record.  Physical Exam: BP (!) 156/74   Pulse 70   Ht 5' 2.5" (1.588 m)   Wt 153 lb (69.4 kg)   BMI 27.54 kg/m  General:   Pleasant, well developed female in no acute distress Heart : Regular rate and rhythm; no murmurs Pulm: Clear anteriorly; no wheezing Abdomen:  Soft, Non-distended AB, Active bowel sounds. No tenderness . , No organomegaly appreciated. Rectal: Not evaluated Extremities:  without  edema. Neurologic:  Alert and  oriented x4;  No focal deficits.  Psych:  Cooperative. Normal mood and affect.   Vladimir Crofts, PA-C 11/15/21

## 2021-11-15 ENCOUNTER — Encounter: Payer: Self-pay | Admitting: Physician Assistant

## 2021-11-15 ENCOUNTER — Ambulatory Visit (INDEPENDENT_AMBULATORY_CARE_PROVIDER_SITE_OTHER): Payer: Medicare Other | Admitting: Physician Assistant

## 2021-11-15 VITALS — BP 156/74 | HR 70 | Ht 62.5 in | Wt 153.0 lb

## 2021-11-15 DIAGNOSIS — K580 Irritable bowel syndrome with diarrhea: Secondary | ICD-10-CM | POA: Diagnosis not present

## 2021-11-15 DIAGNOSIS — Z86010 Personal history of colonic polyps: Secondary | ICD-10-CM | POA: Diagnosis not present

## 2021-11-15 DIAGNOSIS — K219 Gastro-esophageal reflux disease without esophagitis: Secondary | ICD-10-CM

## 2021-11-15 DIAGNOSIS — K5730 Diverticulosis of large intestine without perforation or abscess without bleeding: Secondary | ICD-10-CM | POA: Diagnosis not present

## 2021-11-15 NOTE — Patient Instructions (Addendum)
You have been scheduled for a colonoscopy. Please follow written instructions given to you at your visit today.  Please pick up your prep supplies at the pharmacy within the next 1-3 days. If you use inhalers (even only as needed), please bring them with you on the day of your procedure.   Diverticulosis Diverticulosis is a condition that develops when small pouches (diverticula) form in the wall of the large intestine (colon). The colon is where water is absorbed and stool (feces) is formed. The pouches form when the inside layer of the colon pushes through weak spots in the outer layers of the colon. You may have a few pouches or many of them. The pouches usually do not cause problems unless they become inflamed or infected. When this happens, the condition is called diverticulitis- this is left lower quadrant pain, diarrhea, fever, chills, nausea or vomiting.  If this occurs please call the office or go to the hospital. Sometimes these patches without inflammation can also have painless bleeding associated with them, if this happens please call the office or go to the hospital. Preventing constipation and increasing fiber can help reduce diverticula and prevent complications. Even if you feel you have a high-fiber diet, suggest getting on Benefiber or Cirtracel 2 times daily. Remember belching is caused by excessive air swallowing.   Please stop: Eating or drinking too fast  Poorly fitting dentures; not chewing food completely  Carbonated beverages  Chewing gum or sucking on hard candies  Excessive swallowing due to nervous tension or postnasal drip  Forced belching to relieve abdominal discomfort To prevent excessive belching, avoid:  Carbonated beverages  Chewing gum  Hard candies  Simethicone/GasX may be helpful  Can try Florastor probiotic twice a    FODMAP stands for fermentable oligo-, di-, mono-saccharides and polyols (1). These are the scientific terms used to classify groups of  carbs that are notorious for triggering digestive symptoms like bloating, gas and stomach pain.

## 2022-01-07 ENCOUNTER — Encounter: Payer: Self-pay | Admitting: Gastroenterology

## 2022-01-07 ENCOUNTER — Ambulatory Visit (AMBULATORY_SURGERY_CENTER): Payer: Medicare Other | Admitting: Gastroenterology

## 2022-01-07 VITALS — BP 127/59 | HR 55 | Temp 97.8°F | Resp 16 | Ht 62.5 in | Wt 153.0 lb

## 2022-01-07 DIAGNOSIS — D120 Benign neoplasm of cecum: Secondary | ICD-10-CM | POA: Diagnosis not present

## 2022-01-07 DIAGNOSIS — Z09 Encounter for follow-up examination after completed treatment for conditions other than malignant neoplasm: Secondary | ICD-10-CM

## 2022-01-07 DIAGNOSIS — D123 Benign neoplasm of transverse colon: Secondary | ICD-10-CM

## 2022-01-07 DIAGNOSIS — Z800 Family history of malignant neoplasm of digestive organs: Secondary | ICD-10-CM

## 2022-01-07 DIAGNOSIS — Z86010 Personal history of colonic polyps: Secondary | ICD-10-CM

## 2022-01-07 MED ORDER — SODIUM CHLORIDE 0.9 % IV SOLN: 500.0000 mL | Freq: Once | INTRAVENOUS | Status: DC

## 2022-01-07 NOTE — Patient Instructions (Signed)
Read all of the handouts given to you by your recovery room nurse.  You will not have to have another colonoscopy unless you have issues.  YOU HAD AN ENDOSCOPIC PROCEDURE TODAY AT [facility]:   Refer to the procedure report that was given to you for any specific questions about what was found during the examination.  If the procedure report does not answer your questions, please call your gastroenterologist to clarify.  If you requested that your care partner not be given the details of your procedure findings, then the procedure report has been included in a sealed envelope for you to review at your convenience later.  YOU SHOULD EXPECT: Some feelings of bloating in the abdomen. Passage of more gas than usual.  Walking can help get rid of the air that was put into your GI tract during the procedure and reduce the bloating. If you had a lower endoscopy (such as a colonoscopy or flexible sigmoidoscopy) you may notice spotting of blood in your stool or on the toilet paper. If you underwent a bowel prep for your procedure, you may not have a normal bowel movement for a few days.  Please Note:  You might notice some irritation and congestion in your nose or some drainage.  This is from the oxygen used during your procedure.  There is no need for concern and it should clear up in a day or so.  SYMPTOMS TO REPORT IMMEDIATELY:  Following lower endoscopy (colonoscopy or flexible sigmoidoscopy):  Excessive amounts of blood in the stool  Significant tenderness or worsening of abdominal pains  Swelling of the abdomen that is new, acute  Fever of 100F or higher   For urgent or emergent issues, a gastroenterologist can be reached at any hour by calling [phone]. Do not use MyChart messaging for urgent concerns.    DIET:  We do recommend a small meal at first, but then you may proceed to your regular diet.  Drink plenty of fluids but you should avoid alcoholic beverages for 24  hours. Try to eat a high fiber diet, and drink plenty of water.  ACTIVITY:  You should plan to take it easy for the rest of today and you should NOT DRIVE or use heavy machinery until tomorrow (because of the sedation medicines used during the test).    FOLLOW UP: Our staff will call the number listed on your records the next business day following your procedure.  We will call around 7:15- 8:00 am to check on you and address any questions or concerns that you may have regarding the information given to you following your procedure. If we do not reach you, we will leave a message.     If any biopsies were taken you will be contacted by phone or by letter within the next 1-3 weeks.  Please call us at [phone] if you have not heard about the biopsies in 3 weeks.    SIGNATURES/CONFIDENTIALITY: You and/or your care partner have signed paperwork which will be entered into your electronic medical record.  These signatures attest to the fact that that the information above on your After Visit Summary has been reviewed and is understood.  Full responsibility of the confidentiality of this discharge information lies with you and/or your care-partner.

## 2022-01-07 NOTE — Progress Notes (Signed)
Called to room to assist during endoscopic procedure.  Patient ID and intended procedure confirmed with present staff. Received instructions for my participation in the procedure from the performing physician.  

## 2022-01-07 NOTE — Progress Notes (Signed)
[facility] History and Physical   Primary Care Physician:  Caren Macadam, MD   Reason for Procedure:  History of adenomatous colon polyps, family h/o colon cancer  Plan:    Surveillance colonoscopy with possible interventions as needed     HPI: Michelle Davidson is a very pleasant 76 y.o. female here for surveillance colonoscopy. Denies any nausea, vomiting, abdominal pain, melena or bright red blood per rectum  The risks and benefits as well as alternatives of endoscopic procedure(s) have been discussed and reviewed. All questions answered. The patient agrees to proceed.    Past Medical History:  Diagnosis Date   Allergy    seasonal   Arthritis    Basal cell carcinoma    chest   Cataract    removed from both eyes   Choroidal neovascularization    Right eye   CNVM (choroidal neovascular membrane), right    Fibromyalgia    History of chicken pox    History of colon polyps    Hyperlipidemia    Hypertension    Migraines    Mitral valve prolapse    PAC (premature atrial contraction)    PVC (premature ventricular contraction)     Past Surgical History:  Procedure Laterality Date   BELPHAROPTOSIS REPAIR Right 2010   BREAST BIOPSY Left 2011   Titanium chip implanted to mark the spot/benign findings   COLONOSCOPY     EXCISION METACARPAL MASS Right 01/11/2021   Procedure: RIGHT LONG FINGER EXCISION CYST AND DEBRIDEMENT DISTAL INTERPHALANGEAL JOINT;  Surgeon: Leanora Cover, MD;  Location: [facility];  Service: Orthopedics;  Laterality: Right;  30 MIN   EYE SURGERY Bilateral 03/28/2018   cataract removal with lens implants. Dr.Beavis   TUBAL LIGATION  1978   WISDOM TOOTH EXTRACTION      Prior to Admission medications   Medication Sig Start Date End Date Taking? Authorizing Provider  amitriptyline (ELAVIL) 25 MG tablet TAKE 1 TABLET BY MOUTH EVERYDAY AT BEDTIME 12/10/20   Dutch Quint B, FNP  atenolol (TENORMIN) 25 MG tablet Take 1 tablet (25  mg total) by mouth 2 (two) times daily. 03/08/21   Libby Maw, MD  fluticasone (FLONASE) 50 MCG/ACT nasal spray SPRAY 1 SPRAY INTO BOTH NOSTRILS DAILY. 04/08/21   Libby Maw, MD  hyoscyamine (LEVSIN SL) 0.125 MG SL tablet Place 1 tablet (0.125 mg total) under the tongue every 4 (four) hours as needed. 02/17/19   Zehr, Laban Emperor, PA-C  Multiple Vitamins-Minerals (CENTRUM SILVER 50+WOMEN PO) Take 1 tablet by mouth daily.    [provider]  olmesartan (BENICAR) 20 MG tablet Take 1 tablet by mouth daily. 05/03/21   [provider]    Current Outpatient Medications  Medication Sig Dispense Refill   amitriptyline (ELAVIL) 25 MG tablet TAKE 1 TABLET BY MOUTH EVERYDAY AT BEDTIME 90 tablet 3   atenolol (TENORMIN) 25 MG tablet Take 1 tablet (25 mg total) by mouth 2 (two) times daily. 180 tablet 3   fluticasone (FLONASE) 50 MCG/ACT nasal spray SPRAY 1 SPRAY INTO BOTH NOSTRILS DAILY. 48 mL 3   hyoscyamine (LEVSIN SL) 0.125 MG SL tablet Place 1 tablet (0.125 mg total) under the tongue every 4 (four) hours as needed. 30 tablet 0   Multiple Vitamins-Minerals (CENTRUM SILVER 50+WOMEN PO) Take 1 tablet by mouth daily.     olmesartan (BENICAR) 20 MG tablet Take 1 tablet by mouth daily.     Current Facility-Administered Medications  Medication Dose Route Frequency Provider Last  Rate Last Admin   0.9 %  sodium chloride infusion  500 mL Intravenous Once Mauri Pole, MD        Allergies as of 01/07/2022 - Review Complete 01/07/2022  Allergen Reaction Noted   Esomeprazole magnesium     Lipitor [atorvastatin calcium]  05/19/2017   Zocor [simvastatin]  05/19/2017   Lansoprazole  01/07/2022   Nexium [esomeprazole magnesium]  07/06/2017   Aspirin Rash 01/02/2021   Avelox [moxifloxacin hcl in nacl] Rash 05/19/2017    Family History  Problem Relation Age of Onset   Arthritis Mother    Hyperlipidemia Mother    Hypertension Mother    Colon cancer Father     Colon cancer Sister        mets to liver Dec 26, 2018   Diabetes Paternal Uncle    Kidney cancer Paternal Uncle    Arthritis Maternal Grandmother    Heart attack Maternal Grandfather    Lung cancer Maternal Grandfather        Smoker   Diabetes Paternal Grandmother    Uterine cancer Paternal Grandmother    Skin cancer Paternal Grandfather    Breast cancer Cousin    Stomach cancer Neg Hx    Esophageal cancer Neg Hx    Rectal cancer Neg Hx     Social History   Socioeconomic History   Marital status: Married    Spouse name: Not on file   Number of children: 2   Years of education: Not on file   Highest education level: Not on file  Occupational History   Occupation: retired  Tobacco Use   Smoking status: Former    Types: Cigarettes    Quit date: 1997    Years since quitting: 26.7   Smokeless tobacco: Never  Vaping Use   Vaping Use: Never used  Substance and Sexual Activity   Alcohol use: Yes    Comment: cocktail every night   Drug use: No   Sexual activity: Not Currently    Partners: Male    Comment: 1st intercourse- 17, partners- 37, widow  Other Topics Concern   Not on file  Social History Narrative   Not on file   Social Determinants of Health   Financial Resource Strain: Low Risk  (01/26/2020)   Overall Financial Resource Strain (CARDIA)    Difficulty of Paying Living Expenses: Not hard at all  Food Insecurity: No Food Insecurity (01/26/2020)   Hunger Vital Sign    Worried About Running Out of Food in the Last Year: Never true    Ran Out of Food in the Last Year: Never true  Transportation Needs: No Transportation Needs (01/26/2020)   PRAPARE - Hydrologist (Medical): No    Lack of Transportation (Non-Medical): No  Physical Activity: Inactive (01/26/2020)   Exercise Vital Sign    Days of Exercise per Week: 0 days    Minutes of Exercise per Session: 0 min  Stress: No Stress Concern Present (01/26/2020)   [place]    Feeling of Stress : Not at all  Social Connections: Moderately Isolated (01/26/2020)   Social Connection and Isolation Panel [NHANES]    Frequency of Communication with Friends and Family: More than three times a week    Frequency of Social Gatherings with Friends and Family: Once a week    Attends Religious Services: Never    Marine scientist or Organizations: Yes    Attends  Club or Organization Meetings: More than 4 times per year    Marital Status: Widowed  Intimate Partner Violence: Not At Risk (01/26/2020)   Humiliation, Afraid, Rape, and Kick questionnaire    Fear of Current or Ex-Partner: No    Emotionally Abused: No    Physically Abused: No    Sexually Abused: No    Review of Systems:  All other review of systems negative except as mentioned in the HPI.  Physical Exam: Vital signs in last 24 hours: BP (!) 181/69   Pulse 67   Temp 97.8 F (36.6 C) (Temporal)   Ht 5' 2.5" (1.588 m)   Wt 153 lb (69.4 kg)   SpO2 97%   BMI 27.54 kg/m  General:   Alert, NAD Lungs:  Clear .   Heart:  Regular rate and rhythm Abdomen:  Soft, nontender and nondistended. Neuro/Psych:  Alert and cooperative. Normal mood and affect. A and O x 3  Reviewed labs, radiology imaging, old records and pertinent past GI work up  Patient is appropriate for planned procedure(s) and anesthesia in an ambulatory setting   K. Denzil Magnuson , MD [phone]

## 2022-01-07 NOTE — Op Note (Signed)
[facility] Patient Name: Michelle Davidson Procedure Date: 01/07/2022 11:28 AM MRN: [id number] Endoscopist: Mauri Pole , MD Age: 76 Referring MD:  Date of Birth: [birth year] Gender: Female Account #: [id number] Procedure:                Colonoscopy Indications:              Screening in patient at increased risk: Family                            history of 1st-degree relative with colorectal                            cancer, High risk colon cancer surveillance:                            Personal history of colonic polyps Medicines:                Monitored Anesthesia Care Procedure:                Pre-Anesthesia Assessment:                           - Prior to the procedure, a History and Physical                            was performed, and patient medications and                            allergies were reviewed. The patient's tolerance of                            previous anesthesia was also reviewed. The risks                            and benefits of the procedure and the sedation                            options and risks were discussed with the patient.                            All questions were answered, and informed consent                            was obtained. Prior Anticoagulants: The patient has                            taken no previous anticoagulant or antiplatelet                            agents. ASA Grade Assessment: II - A patient with                            mild systemic disease. After reviewing the risks  and benefits, the patient was deemed in                            satisfactory condition to undergo the procedure.                           After obtaining informed consent, the colonoscope                            was passed under direct vision. Throughout the                            procedure, the patient's blood pressure, pulse, and                            oxygen saturations were  monitored continuously. The                            Olympus PCF-H190DL ([id number]) Colonoscope was                            introduced through the anus and advanced to the the                            cecum, identified by appendiceal orifice and                            ileocecal valve. The colonoscopy was performed                            without difficulty. The patient tolerated the                            procedure well. The quality of the bowel                            preparation was good. The ileocecal valve,                            appendiceal orifice, and rectum were photographed. Scope In: 11:33:37 AM Scope Out: 11:48:49 AM Scope Withdrawal Time: 0 hours 5 minutes 38 seconds  Total Procedure Duration: 0 hours 15 minutes 12 seconds  Findings:                 The perianal and digital rectal examinations were                            normal.                           Four sessile polyps were found in the transverse                            colon and cecum. The polyps were 3 to 9 mm in size.  These polyps were removed with a cold snare.                            Resection and retrieval were complete.                           Multiple small and large-mouthed diverticula were                            found in the sigmoid colon.                           Non-bleeding external and internal hemorrhoids were                            found during retroflexion. Complications:            No immediate complications. Estimated Blood Loss:     Estimated blood loss was minimal. Impression:               - Four 3 to 9 mm polyps in the transverse colon and                            in the cecum, removed with a cold snare. Resected                            and retrieved.                           - Diverticulosis in the sigmoid colon.                           - Non-bleeding external and internal hemorrhoids. Recommendation:           -  Patient has a contact number available for                            emergencies. The signs and symptoms of potential                            delayed complications were discussed with the                            patient. Return to normal activities tomorrow.                            Written discharge instructions were provided to the                            patient.                           - Resume previous diet.                           - Continue present medications.                           -  Await pathology results.                           - No repeat colonoscopy due to age. Mauri Pole, MD 01/07/2022 11:54:27 AM This report has been signed electronically.

## 2022-01-07 NOTE — Progress Notes (Signed)
PT taken to PACU. Monitors in place. VSS. Report given to RN. 

## 2022-01-08 ENCOUNTER — Telehealth: Payer: Self-pay

## 2022-01-08 NOTE — Telephone Encounter (Signed)
Attempted to reach patient for post-procedure f/u call. Left message for her to please not hesitate to call us if she has any questions/concerns regarding her care.

## 2022-01-09 ENCOUNTER — Encounter: Payer: Self-pay | Admitting: Gastroenterology

## 2022-02-08 DIAGNOSIS — Z23 Encounter for immunization: Secondary | ICD-10-CM | POA: Diagnosis not present

## 2022-03-19 IMAGING — MG MM DIGITAL SCREENING BILAT W/ TOMO AND CAD
8 series · 8 of 24 positions shown · non-contrast
Comparison: Previous exam(s).

CLINICAL DATA: Screening.

EXAM:
DIGITAL SCREENING BILATERAL MAMMOGRAM WITH TOMOSYNTHESIS AND CAD
TECHNIQUE: Bilateral screening digital craniocaudal and mediolateral oblique
mammograms were obtained. Bilateral screening digital breast
tomosynthesis was performed. The images were evaluated with
computer-aided detection.

[L CC synth-2D]
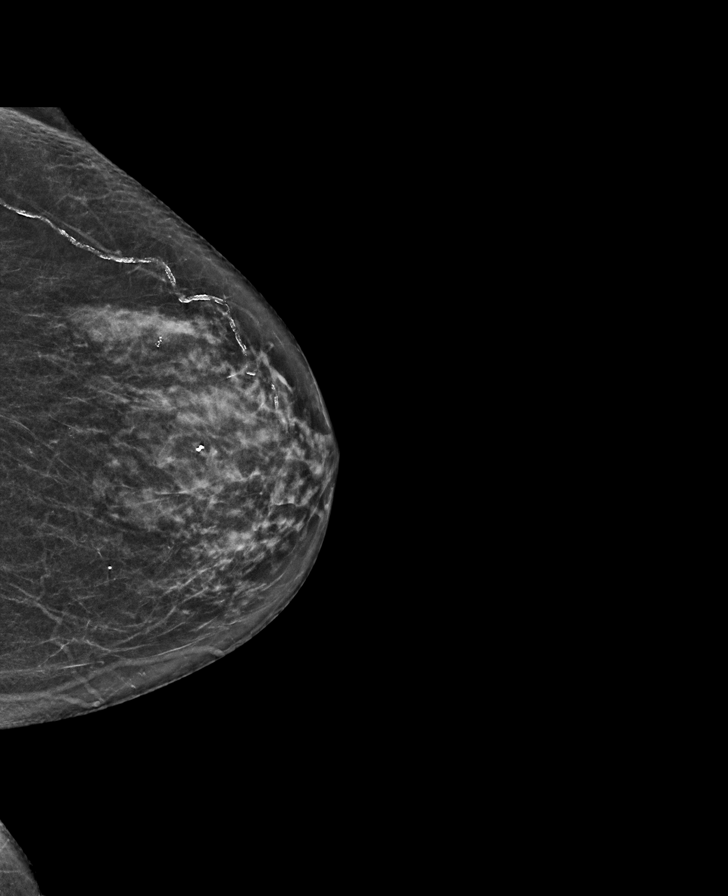

[L MLO synth-2D]
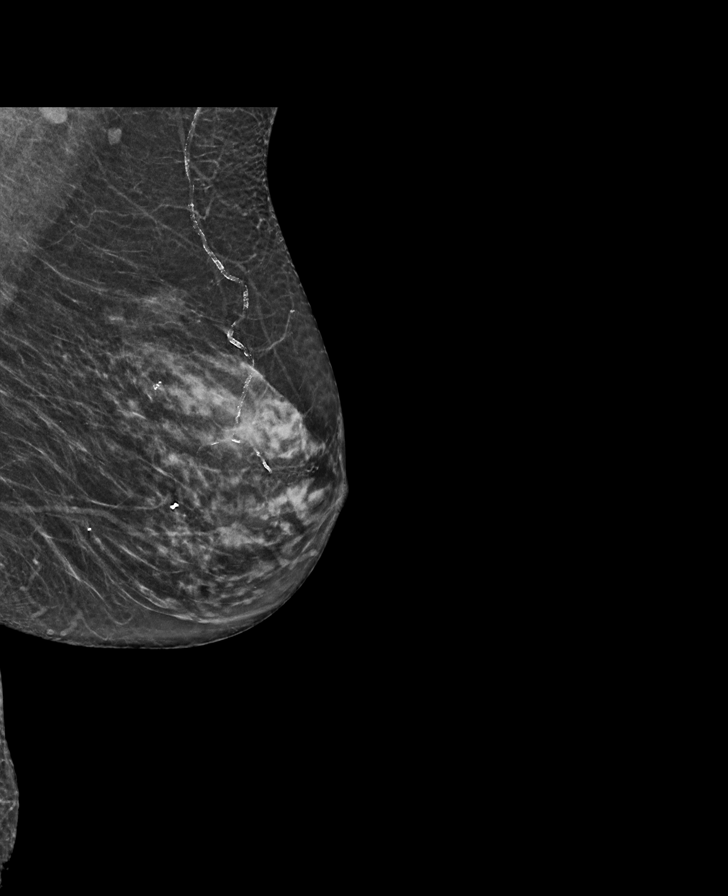

[R CC synth-2D]
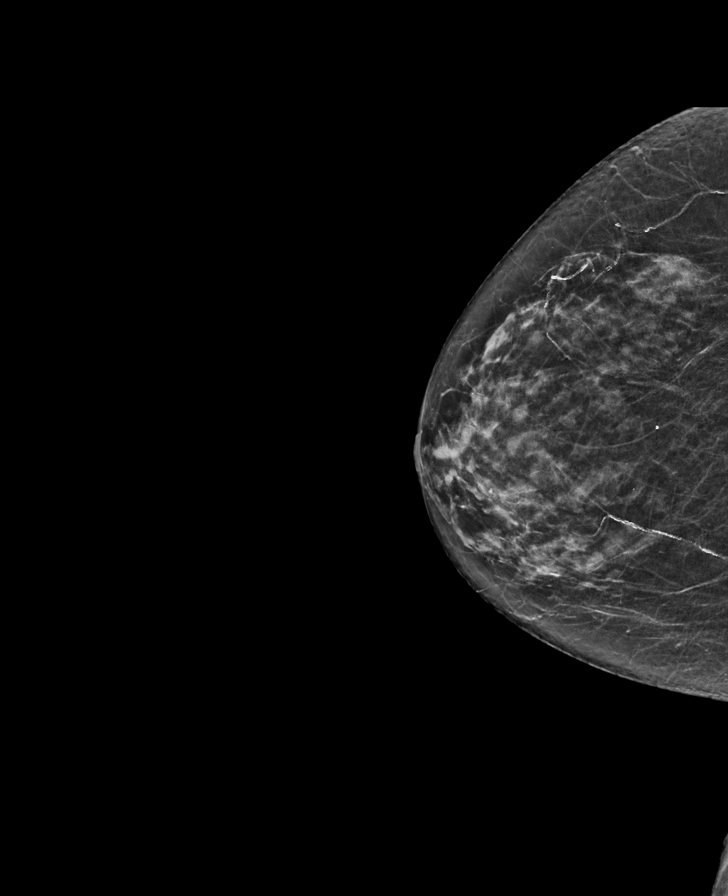

[R MLO synth-2D]
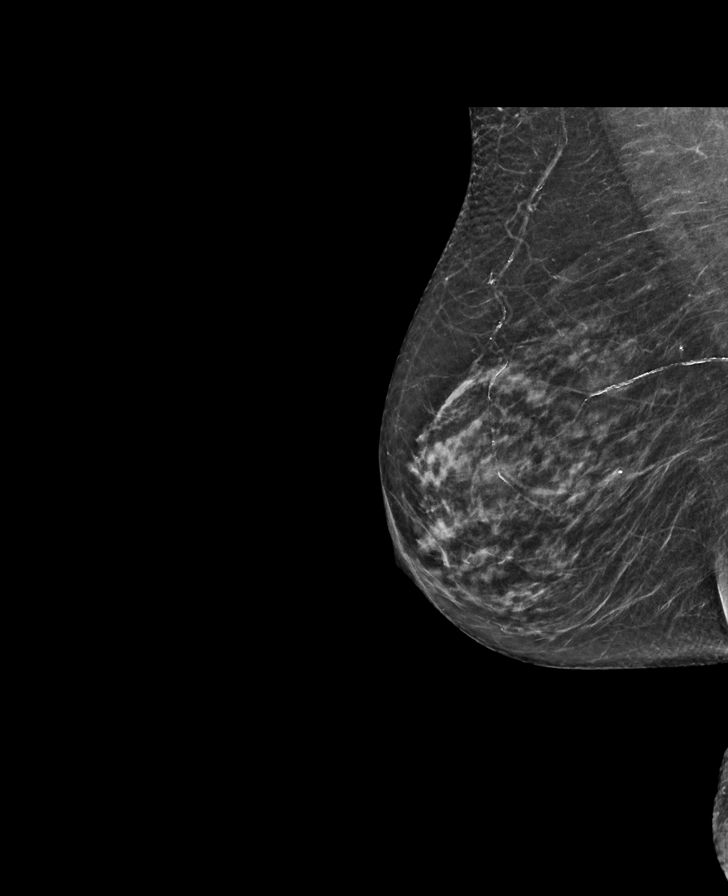

[L MLO tomo · tomo slice 29/57.0]
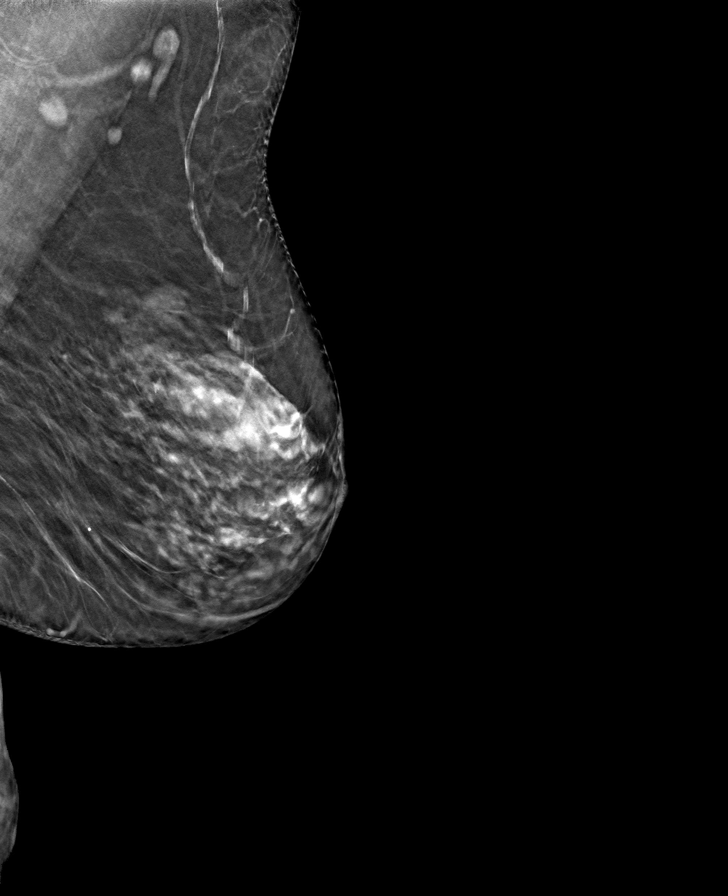

[R CC tomo · tomo slice 27/53.0]
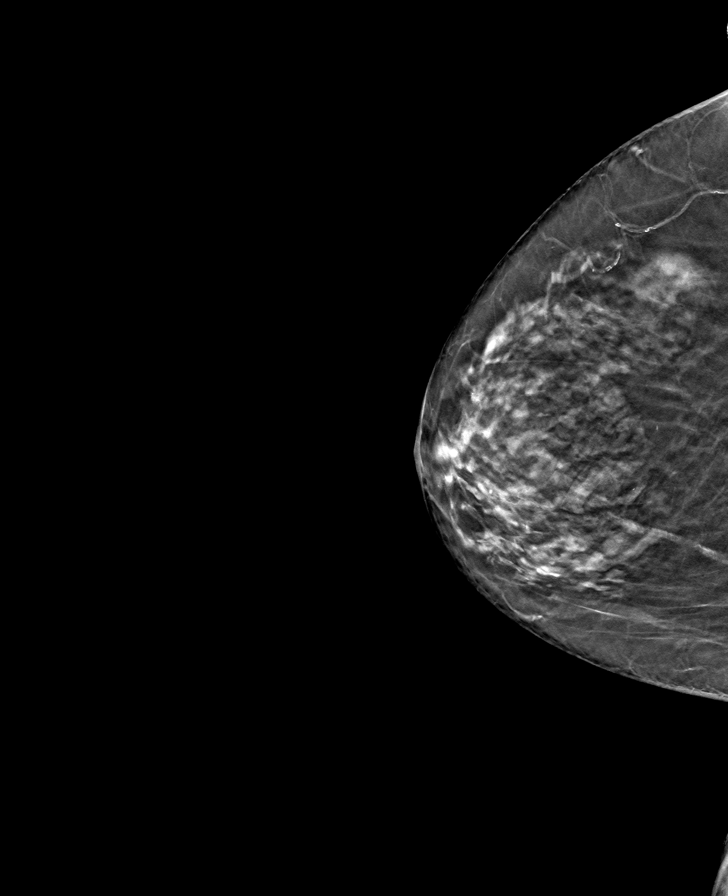

[L CC tomo · tomo slice 29/56.0]
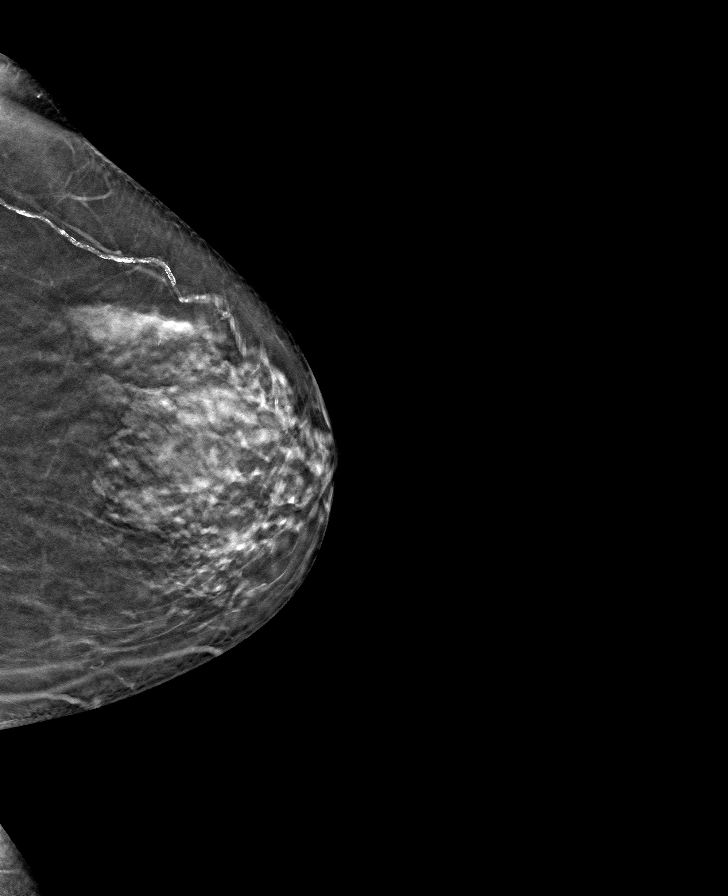

[R MLO tomo · tomo slice 29/58.0]
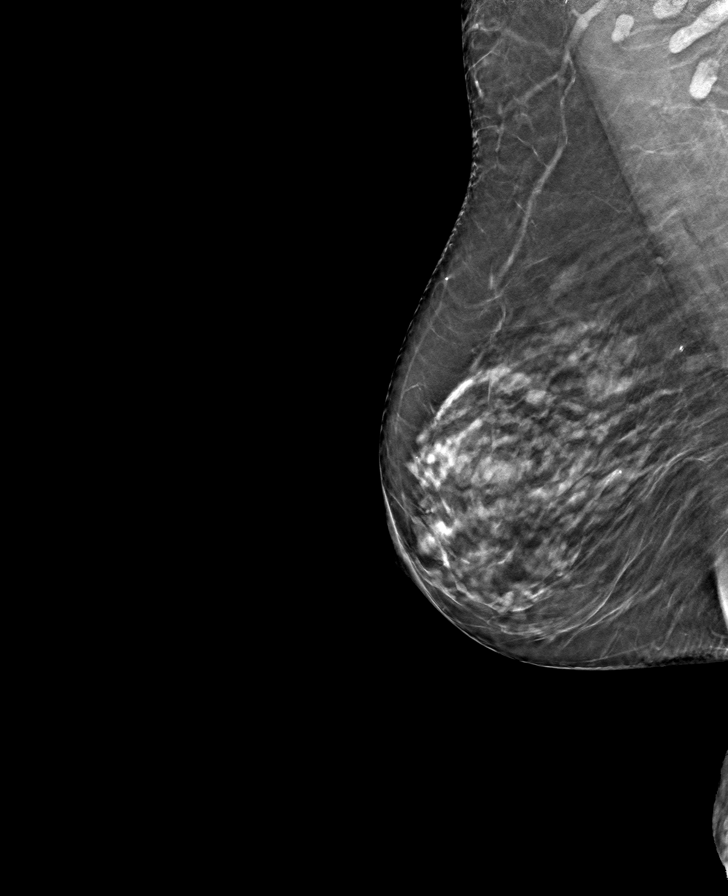

[8 of 24 positions shown; findings below may reference images not displayed]

ACR Breast Density Category c: The breast tissue is heterogeneously
dense, which may obscure small masses.
FINDINGS: There are no findings suspicious for malignancy.
IMPRESSION: No mammographic evidence of malignancy. A result letter of this
screening mammogram will be mailed directly to the patient.

RECOMMENDATION:
Screening mammogram in one year. (Code:Q3-W-BC3)

BI-RADS CATEGORY  1: Negative.

## 2022-04-09 ENCOUNTER — Other Ambulatory Visit: Payer: Self-pay | Admitting: Family Medicine

## 2022-04-09 DIAGNOSIS — Z1231 Encounter for screening mammogram for malignant neoplasm of breast: Secondary | ICD-10-CM

## 2022-04-11 DIAGNOSIS — H35372 Puckering of macula, left eye: Secondary | ICD-10-CM | POA: Diagnosis not present

## 2022-04-11 DIAGNOSIS — Z961 Presence of intraocular lens: Secondary | ICD-10-CM | POA: Diagnosis not present

## 2022-04-29 DIAGNOSIS — H4422 Degenerative myopia, left eye: Secondary | ICD-10-CM | POA: Diagnosis not present

## 2022-04-29 DIAGNOSIS — H442E1 Degenerative myopia with other maculopathy, right eye: Secondary | ICD-10-CM | POA: Diagnosis not present

## 2022-04-29 DIAGNOSIS — H43823 Vitreomacular adhesion, bilateral: Secondary | ICD-10-CM | POA: Diagnosis not present

## 2022-04-29 DIAGNOSIS — H43393 Other vitreous opacities, bilateral: Secondary | ICD-10-CM | POA: Diagnosis not present

## 2022-06-04 ENCOUNTER — Ambulatory Visit: Payer: Medicare Other

## 2022-06-04 ENCOUNTER — Ambulatory Visit
Admission: RE | Admit: 2022-06-04 | Discharge: 2022-06-04 | Disposition: A | Discharge status: Home / Self Care | Payer: Medicare Other | Source: Ambulatory Visit | Attending: Family Medicine | Admitting: Family Medicine

## 2022-06-04 DIAGNOSIS — Z1231 Encounter for screening mammogram for malignant neoplasm of breast: Secondary | ICD-10-CM | POA: Diagnosis not present

## 2022-06-20 DIAGNOSIS — J029 Acute pharyngitis, unspecified: Secondary | ICD-10-CM | POA: Diagnosis not present

## 2022-06-20 DIAGNOSIS — Z03818 Encounter for observation for suspected exposure to other biological agents ruled out: Secondary | ICD-10-CM | POA: Diagnosis not present

## 2022-06-20 DIAGNOSIS — R053 Chronic cough: Secondary | ICD-10-CM | POA: Diagnosis not present

## 2022-06-20 DIAGNOSIS — R030 Elevated blood-pressure reading, without diagnosis of hypertension: Secondary | ICD-10-CM | POA: Diagnosis not present

## 2022-07-07 ENCOUNTER — Encounter: Payer: Self-pay | Admitting: Pulmonary Disease

## 2022-07-07 ENCOUNTER — Ambulatory Visit (INDEPENDENT_AMBULATORY_CARE_PROVIDER_SITE_OTHER): Payer: Medicare Other | Admitting: Pulmonary Disease

## 2022-07-07 VITALS — BP 130/82 | HR 59 | Ht 62.0 in | Wt 157.6 lb

## 2022-07-07 DIAGNOSIS — R053 Chronic cough: Secondary | ICD-10-CM

## 2022-07-07 MED ORDER — IPRATROPIUM BROMIDE 0.06 % NA SOLN: 2.0000 | Freq: Two times a day (BID) | NASAL | 12 refills | Status: DC

## 2022-07-07 NOTE — Patient Instructions (Addendum)
It is nice to meet you  I recommend getting a chest x-ray to make sure everything looks fine, your lungs sound good so anticipate this will look good as well.  Please arrive tomorrow morning between 845 and 9 AM, you can walk-in and say need a chest x-ray and that should bring you right back.  Then you can leave immediately when the chest x-ray is complete.  The cough worse at night could be a few things but the 2 most concerning things to me would be 1) irritation of the mucus that originates in the nose or sinus that can accumulate overnight and cause you to expectorate mucus in the morning which sounds like is happening and 2) reflux that can be silent or asymptomatic that can cause a dry cough throughout the day  Both can be worse in the evenings  I recommend the following:  1) use Flonase 1 spray each nostril twice a day for 1 week then decrease to 1 spray each nostril once a day thereafter for additional 3 weeks  2) ipratropium nasal spray 2 sprays each nostril twice a day for 4 weeks  3) Afrin nasal spray 2 sprays each nostril twice a day for 3 days then stop (over-the-counter)  4) antihistamine, cetirizine 5 mg tablet in the evenings, prior to bed (can make people sleepy so recommend taking it in the evenings) for 4 weeks (over-the-counter)  See me a message in a few weeks on MyChart if you can, and let me know if things are getting better or not with this regimen  If things are not better, I can make recommendations in the interim before your next office visit  Return to clinic in 3 months or sooner as needed with Dr. Silas Flood

## 2022-07-07 NOTE — Progress Notes (Signed)
$'@Patient't$  ID: Michelle Davidson, female    DOB: 1946/02/21, 77 y.o.   MRN: [id number]  Chief Complaint  Patient presents with   Consult    Pt is here for consult for cough. Sore throat noted and the feeling of something in her throat and more on the left side. Strep, rsv and covid all negative. Pt states she has had this feeling for "forever". Cough only comes at night. Pt is on flonase. Pt states cough only occurs when she lays down and lays on left side she is coughing.     Referring provider: Antony Contras, MD  HPI:   77 y.o. woman whom we are seeing for evaluation of chronic cough.  Most recent GI note x 2 reviewed.  Patient is called for a year or more.  Does not think has been present for more than back currently not 2 years.  Worse when she lies down or lies on her left side.  Almost immediate coughing when she lies in bed.  Able to go to sleep and not awoken by cough thereafter.  She has phlegm production, expectorates phlegm in the morning.  Decent amount of phlegm that is accumulated.  Dry cough otherwise throughout the day.  She has seasonal allergies, rhinorrhea.  Been a problem and an issue for many years.  Resolved when she lived at the [place].  Since moving back here few years ago, symptom has returned.  She uses Flonase for the nasal congestion.  Has not really helped her cough.  She decreased recently from 2 sprays once a day to 1 spray once a day.  No appreciable change improvement or worsening cough with change  Most recent chest x-ray 2002 result reviewed interpreted as clear lungs per radiology report.  CT abdomen pelvis 02/2019 with portion of lungs again reviewed demonstrate clear lungs on my review interpretation.  She denies history of asthma.  She denies history of GERD or reflux symptoms currently.  Her GI note from 2023 has a diagnosis of reflux.  PMH: Hypertension, arrhythmia, hyperlipidemia, seasonal allergies, GERD Surgical history: Tubal ligation, Family  history: Colon cancer father, sister Social history: mild smoking history, quit in 1990s, lives in [place] / Pulmonary Flowsheets:   ACT:      No data to display          MMRC:     No data to display          Epworth:      No data to display          Tests:   FENO:  No results found for: "NITRICOXIDE"  PFT:     No data to display          WALK:      No data to display          Imaging: No results found.  Lab Results: Personally reviewed CBC    Component Value Date/Time   WBC 6.9 04/12/2021 1335   RBC 4.31 04/12/2021 1335   HGB 13.3 04/12/2021 1335   HCT 39.1 04/12/2021 1335   PLT 311.0 04/12/2021 1335   MCV 90.8 04/12/2021 1335   MCHC 33.9 04/12/2021 1335   RDW 12.4 04/12/2021 1335   LYMPHSABS 1.8 04/12/2021 1335   MONOABS 0.6 04/12/2021 1335   EOSABS 0.1 04/12/2021 1335   BASOSABS 0.0 04/12/2021 1335    BMET    Component Value Date/Time   NA 132 (L) 04/12/2021 1335   K 3.8 04/12/2021 1335  CL 92 (L) 04/12/2021 1335   CO2 30 04/12/2021 1335   GLUCOSE 131 (H) 04/12/2021 1335   BUN 12 04/12/2021 1335   CREATININE 0.87 04/12/2021 1335   CALCIUM 10.0 04/12/2021 1335   GFRNONAA >60 01/07/2021 1255    BNP No results found for: "BNP"  ProBNP No results found for: "PROBNP"  Specialty Problems       Pulmonary Problems   Persistent cough    Allergies  Allergen Reactions   Esomeprazole Magnesium     Syncope   Lipitor [Atorvastatin Calcium]     Muscle aches   Zocor [Simvastatin]     Muscle aches   Lansoprazole     Other reaction(s): Not available   Nexium [Esomeprazole Magnesium]    Aspirin Rash   Avelox [Moxifloxacin Hcl In Nacl] Rash    Immunization History  Administered Date(s) Administered   Fluad Quad(high Dose 65+) 02/08/2022   Influenza Split 01/27/2011, 01/27/2012, 01/26/2013, 01/26/2014, 02/27/2015   Influenza, High Dose Seasonal PF 02/09/2017, 02/04/2018, 01/04/2019   Influenza,  Quadrivalent, Recombinant, Inj, Pf 03/27/2016   Influenza-Unspecified 01/06/2020, 01/31/2021   Moderna Covid-19 Vaccine Bivalent Booster 93yr & up 01/31/2021   PFIZER(Purple Top)SARS-COV-2 Vaccination 06/10/2019, 07/03/2019, 01/25/2020, 08/31/2020   Pneumococcal Conjugate-13 07/27/2013   Pneumococcal Polysaccharide-23 04/28/2009   Tdap 12/15/2016   Unspecified SARS-COV-2 Vaccination 02/08/2022   Zoster Recombinat (Shingrix) 09/03/2017, 11/30/2017    Past Medical History:  Diagnosis Date   Allergy    seasonal   Arthritis    Basal cell carcinoma    chest   Cataract    removed from both eyes   Choroidal neovascularization    Right eye   CNVM (choroidal neovascular membrane), right    Fibromyalgia    History of chicken pox    History of colon polyps    Hyperlipidemia    Hypertension    Migraines    Mitral valve prolapse    PAC (premature atrial contraction)    PVC (premature ventricular contraction)     Tobacco History: Social History   Tobacco Use  Smoking Status Former   Types: Cigarettes   Quit date: 1997   Years since quitting: 27.2  Smokeless Tobacco Never   Counseling given: Not Answered   Continue to not smoke  Outpatient Encounter Medications as of 07/07/2022  Medication Sig   amitriptyline (ELAVIL) 25 MG tablet TAKE 1 TABLET BY MOUTH EVERYDAY AT BEDTIME   atenolol (TENORMIN) 25 MG tablet Take 1 tablet (25 mg total) by mouth 2 (two) times daily.   fluticasone (FLONASE) 50 MCG/ACT nasal spray SPRAY 1 SPRAY INTO BOTH NOSTRILS DAILY.   hyoscyamine (LEVSIN SL) 0.125 MG SL tablet Place 1 tablet (0.125 mg total) under the tongue every 4 (four) hours as needed.   ipratropium (ATROVENT) 0.06 % nasal spray Place 2 sprays into both nostrils 2 (two) times daily.   Multiple Vitamins-Minerals (CENTRUM SILVER 50+WOMEN PO) Take 1 tablet by mouth daily.   olmesartan (BENICAR) 20 MG tablet Take 1 tablet by mouth daily.   No facility-administered encounter medications on  file as of 07/07/2022.     Review of Systems  Review of Systems  No chest pain exertion.  No orthopnea or PND.  Comprehensive review of systems otherwise negative. Physical Exam  Pulse (!) 59   Ht '5\' 2"'$  (1.575 m)   Wt 157 lb 9.6 oz (71.5 kg)   SpO2 95%   BMI 28.83 kg/m   Wt Readings from Last 5 Encounters:  07/07/22 157 lb 9.6 oz (  71.5 kg)  01/07/22 153 lb (69.4 kg)  11/15/21 153 lb (69.4 kg)  03/08/21 156 lb (70.8 kg)  01/11/21 154 lb 5.2 oz (70 kg)    BMI Readings from Last 5 Encounters:  07/07/22 28.83 kg/m  01/07/22 27.54 kg/m  11/15/21 27.54 kg/m  03/08/21 28.53 kg/m  01/11/21 27.78 kg/m     Physical Exam General: Sitting in chair, no acute distress Eyes: EOMI, no icterus Neck: Supple, no JVP Pulmonary: Clear, normal work of breathing Cardiovascular: Warm, no edema Abdomen: Nondistended, bowel sounds present MSK: No synovitis, no joint effusion Neuro: Normal gait, no weakness Psych: Normal mood, full affect   Assessment & Plan:   Chronic cough: Unclear etiology.  No chest images.  She has chronic rhinorrhea, nasal allergies.  Suspect this could be a culprit.  Phlegm production in the morning.  But otherwise clear throughout the day.  Flonase twice daily for a week then once daily thereafter, ipratropium twice a day for 4 weeks, Afrin twice daily for 3 days then stop, add cetirizine, antihistamine at night.  If not improving in the coming weeks, would likely target reflux.  She denies reflux symptoms but with her dry cough today this is a possibility.  Will do a PPI for a few weeks.  If that does not help, would like to go on ICS/LABA therapy.  Discussed at length the most common causes of chronic cough especially with duration she has had over 1 year include nasal drip, GERD, asthma.  Chest x-ray today for further evaluation.   Return in about 3 months (around 10/07/2022).   Lanier Clam, MD 07/07/2022

## 2022-07-08 ENCOUNTER — Other Ambulatory Visit: Payer: Self-pay | Admitting: Pulmonary Disease

## 2022-07-08 DIAGNOSIS — R053 Chronic cough: Secondary | ICD-10-CM

## 2022-07-10 ENCOUNTER — Ambulatory Visit (INDEPENDENT_AMBULATORY_CARE_PROVIDER_SITE_OTHER): Payer: Medicare Other

## 2022-07-10 DIAGNOSIS — R053 Chronic cough: Secondary | ICD-10-CM | POA: Diagnosis not present

## 2022-07-18 ENCOUNTER — Encounter: Payer: Self-pay | Admitting: Pulmonary Disease

## 2022-07-29 DIAGNOSIS — Z85828 Personal history of other malignant neoplasm of skin: Secondary | ICD-10-CM | POA: Diagnosis not present

## 2022-07-29 DIAGNOSIS — D0462 Carcinoma in situ of skin of left upper limb, including shoulder: Secondary | ICD-10-CM | POA: Diagnosis not present

## 2022-07-29 DIAGNOSIS — L821 Other seborrheic keratosis: Secondary | ICD-10-CM | POA: Diagnosis not present

## 2022-07-29 DIAGNOSIS — L814 Other melanin hyperpigmentation: Secondary | ICD-10-CM | POA: Diagnosis not present

## 2022-07-29 DIAGNOSIS — D225 Melanocytic nevi of trunk: Secondary | ICD-10-CM | POA: Diagnosis not present

## 2022-07-29 DIAGNOSIS — C44719 Basal cell carcinoma of skin of left lower limb, including hip: Secondary | ICD-10-CM | POA: Diagnosis not present

## 2022-07-29 DIAGNOSIS — D485 Neoplasm of uncertain behavior of skin: Secondary | ICD-10-CM | POA: Diagnosis not present

## 2022-07-29 DIAGNOSIS — D1801 Hemangioma of skin and subcutaneous tissue: Secondary | ICD-10-CM | POA: Diagnosis not present

## 2022-08-07 NOTE — Progress Notes (Signed)
Chest x-ray shows signs of may be chronic bronchitis.  If the nasal sprays are not helping the cough, may be worth trying an inhaler as neck step.

## 2022-08-19 ENCOUNTER — Encounter: Payer: Self-pay | Admitting: Pulmonary Disease

## 2022-10-07 ENCOUNTER — Ambulatory Visit: Payer: Medicare Other | Admitting: Pulmonary Disease

## 2022-10-07 DIAGNOSIS — Z0000 Encounter for general adult medical examination without abnormal findings: Secondary | ICD-10-CM | POA: Diagnosis not present

## 2022-10-07 DIAGNOSIS — R002 Palpitations: Secondary | ICD-10-CM | POA: Diagnosis not present

## 2022-10-07 DIAGNOSIS — R7303 Prediabetes: Secondary | ICD-10-CM | POA: Diagnosis not present

## 2022-10-07 DIAGNOSIS — Z13220 Encounter for screening for lipoid disorders: Secondary | ICD-10-CM | POA: Diagnosis not present

## 2022-10-07 DIAGNOSIS — Z136 Encounter for screening for cardiovascular disorders: Secondary | ICD-10-CM | POA: Diagnosis not present

## 2022-10-07 DIAGNOSIS — I10 Essential (primary) hypertension: Secondary | ICD-10-CM | POA: Diagnosis not present

## 2022-11-07 DIAGNOSIS — H442E1 Degenerative myopia with other maculopathy, right eye: Secondary | ICD-10-CM | POA: Diagnosis not present

## 2022-11-07 DIAGNOSIS — H43811 Vitreous degeneration, right eye: Secondary | ICD-10-CM | POA: Diagnosis not present

## 2022-11-07 DIAGNOSIS — H4422 Degenerative myopia, left eye: Secondary | ICD-10-CM | POA: Diagnosis not present

## 2022-11-07 DIAGNOSIS — H43823 Vitreomacular adhesion, bilateral: Secondary | ICD-10-CM | POA: Diagnosis not present

## 2022-11-07 DIAGNOSIS — H43393 Other vitreous opacities, bilateral: Secondary | ICD-10-CM | POA: Diagnosis not present

## 2022-11-11 DIAGNOSIS — I10 Essential (primary) hypertension: Secondary | ICD-10-CM | POA: Diagnosis not present

## 2022-11-25 ENCOUNTER — Encounter: Payer: Self-pay | Admitting: Pulmonary Disease

## 2022-11-25 ENCOUNTER — Ambulatory Visit (INDEPENDENT_AMBULATORY_CARE_PROVIDER_SITE_OTHER): Payer: Medicare Other | Admitting: Pulmonary Disease

## 2022-11-25 VITALS — BP 138/82 | HR 57 | Ht 62.5 in | Wt 157.6 lb

## 2022-11-25 DIAGNOSIS — R053 Chronic cough: Secondary | ICD-10-CM | POA: Diagnosis not present

## 2022-11-25 NOTE — Progress Notes (Signed)
@Patient  ID: Michelle Davidson, female    DOB: 01-Mar-1946, 77 y.o.   MRN: [id number]  Chief Complaint  Patient presents with   Follow-up    Pt states cough is better. All her medications started drying her out too much.     Referring provider: Aliene Beams, MD  HPI:   77 y.o. woman whom we are seeing for evaluation of chronic cough.    Return to clinic for routine follow-up.  Chronic nasal allergies.  [place]  Was having a lot of issues with cough and congestion.  Escalated regimen to continue Flonase, added antihistamine oral, added intranasal ipratropium, added Afrin for a few days.  Overall cough is improved.  Not totally gone but improved.  Sleeping at night, nocturnal cough much better overall.  Discussed results of chest x-ray, clear with mild bronchitic changes.  Discussed this could be residual inflammation from secretions dripping down into the lungs versus a separate issue such as mild asthma.  After shared decision-making, given improved symptoms, no additional medications were added today.  HPI at initial visit: Patient coughed for a year or more.  Does not think has been present for more than back currently not 2 years.  Worse when she lies down or lies on her left side.  Almost immediate coughing when she lies in bed.  Able to go to sleep and not awoken by cough thereafter.  She has phlegm production, expectorates phlegm in the morning.  Decent amount of phlegm that is accumulated.  Dry cough otherwise throughout the day.  She has seasonal allergies, rhinorrhea.  Been a problem and an issue for many years.  [place]  [place] few years ago, symptom has returned.  She uses Flonase for the nasal congestion.  Has not really helped her cough.  She decreased recently from 2 sprays once a day to 1 spray once a day.  No appreciable change improvement or worsening cough with change  Most  recent chest x-ray 2002 result reviewed interpreted as clear lungs per radiology report.  CT abdomen pelvis 02/2019 with portion of lungs again reviewed demonstrate clear lungs on my review interpretation.  She denies history of asthma.  She denies history of GERD or reflux symptoms currently.  Her GI note from 2023 has a diagnosis of reflux.  PMH: Hypertension, arrhythmia, hyperlipidemia, seasonal allergies, GERD Surgical history: Tubal ligation, Family history: Colon cancer father, sister Social history: mild smoking history, quit in 1990s, lives in [place] / Pulmonary Flowsheets:   ACT:      No data to display          MMRC:     No data to display          Epworth:      No data to display          Tests:   FENO:  No results found for: "NITRICOXIDE"  PFT:     No data to display          WALK:      No data to display          Imaging: No results found.  Lab Results: Personally reviewed CBC    Component Value Date/Time   WBC 6.9 04/12/2021 1335   RBC 4.31 04/12/2021 1335   HGB 13.3 04/12/2021 1335   HCT 39.1 04/12/2021 1335   PLT 311.0 04/12/2021 1335  MCV 90.8 04/12/2021 1335   MCHC 33.9 04/12/2021 1335   RDW 12.4 04/12/2021 1335   LYMPHSABS 1.8 04/12/2021 1335   MONOABS 0.6 04/12/2021 1335   EOSABS 0.1 04/12/2021 1335   BASOSABS 0.0 04/12/2021 1335    BMET    Component Value Date/Time   NA 132 (L) 04/12/2021 1335   K 3.8 04/12/2021 1335   CL 92 (L) 04/12/2021 1335   CO2 30 04/12/2021 1335   GLUCOSE 131 (H) 04/12/2021 1335   BUN 12 04/12/2021 1335   CREATININE 0.87 04/12/2021 1335   CALCIUM 10.0 04/12/2021 1335   GFRNONAA >60 01/07/2021 1255    BNP No results found for: "BNP"  ProBNP No results found for: "PROBNP"  Specialty Problems       Pulmonary Problems   Persistent cough    Allergies  Allergen Reactions   Esomeprazole Magnesium     Syncope   Lipitor [Atorvastatin Calcium]      Muscle aches   Zocor [Simvastatin]     Muscle aches   Lansoprazole     Other reaction(s): Not available   Nexium [Esomeprazole Magnesium]    Aspirin Rash   Avelox [Moxifloxacin Hcl In Nacl] Rash    Immunization History  Administered Date(s) Administered   Fluad Quad(high Dose 65+) 02/08/2022   Influenza Split 01/27/2011, 01/27/2012, 01/26/2013, 01/26/2014, 02/27/2015   Influenza, High Dose Seasonal PF 02/09/2017, 02/04/2018, 01/04/2019   Influenza, Quadrivalent, Recombinant, Inj, Pf 03/27/2016   Influenza-Unspecified 01/06/2020, 01/31/2021   Moderna Covid-19 Vaccine Bivalent Booster 37yrs & up 01/31/2021   PFIZER(Purple Top)SARS-COV-2 Vaccination 06/10/2019, 07/03/2019, 01/25/2020, 08/31/2020   Pneumococcal Conjugate-13 07/27/2013   Pneumococcal Polysaccharide-23 04/28/2009   Tdap 12/15/2016   Unspecified SARS-COV-2 Vaccination 02/08/2022   Zoster Recombinant(Shingrix) 09/03/2017, 11/30/2017    Past Medical History:  Diagnosis Date   Allergy    seasonal   Arthritis    Basal cell carcinoma    chest   Cataract    removed from both eyes   Choroidal neovascularization    Right eye   CNVM (choroidal neovascular membrane), right    Fibromyalgia    History of chicken pox    History of colon polyps    Hyperlipidemia    Hypertension    Migraines    Mitral valve prolapse    PAC (premature atrial contraction)    PVC (premature ventricular contraction)     Tobacco History: Social History   Tobacco Use  Smoking Status Former   Current packs/day: 0.00   Types: Cigarettes   Quit date: 1997   Years since quitting: 27.5  Smokeless Tobacco Never   Counseling given: Not Answered   Continue to not smoke  Outpatient Encounter Medications as of 11/25/2022  Medication Sig   amitriptyline (ELAVIL) 25 MG tablet TAKE 1 TABLET BY MOUTH EVERYDAY AT BEDTIME   atenolol (TENORMIN) 25 MG tablet Take 1 tablet (25 mg total) by mouth 2 (two) times daily.   fluticasone (FLONASE) 50  MCG/ACT nasal spray SPRAY 1 SPRAY INTO BOTH NOSTRILS DAILY.   hyoscyamine (LEVSIN SL) 0.125 MG SL tablet Place 1 tablet (0.125 mg total) under the tongue every 4 (four) hours as needed.   Multiple Vitamins-Minerals (CENTRUM SILVER 50+WOMEN PO) Take 1 tablet by mouth daily.   olmesartan (BENICAR) 20 MG tablet Take 20 mg by mouth daily. 40mg    [DISCONTINUED] ipratropium (ATROVENT) 0.06 % nasal spray Place 2 sprays into both nostrils 2 (two) times daily.   No facility-administered encounter medications on file as of 11/25/2022.  Review of Systems  Review of Systems  N/a Physical Exam  BP 138/82   Pulse (!) 57   Ht 5' 2.5" (1.588 m)   Wt 157 lb 9.6 oz (71.5 kg)   SpO2 98%   BMI 28.37 kg/m   Wt Readings from Last 5 Encounters:  11/25/22 157 lb 9.6 oz (71.5 kg)  07/07/22 157 lb 9.6 oz (71.5 kg)  01/07/22 153 lb (69.4 kg)  11/15/21 153 lb (69.4 kg)  03/08/21 156 lb (70.8 kg)    BMI Readings from Last 5 Encounters:  11/25/22 28.37 kg/m  07/07/22 28.83 kg/m  01/07/22 27.54 kg/m  11/15/21 27.54 kg/m  03/08/21 28.53 kg/m     Physical Exam General: Sitting in chair, no acute distress Eyes: EOMI, no icterus Neck: Supple, no JVP Pulmonary: Clear, normal work of breathing Cardiovascular: Warm, no edema Abdomen: Nondistended, bowel sounds present MSK: No synovitis, no joint effusion Neuro: Normal gait, no weakness Psych: Normal mood, full affect   Assessment & Plan:   Chronic cough: Chest is relatively clear mild bronchitic changes spring 2024.  She has chronic rhinorrhea, nasal allergies.  Suspect this is likely culprit or main driver of symptoms.   Phlegm production in the morning.  But otherwise clear throughout the day.  Flonase twice daily for a week then once daily thereafter, ipratropium twice a day for 4 weeks, Afrin twice daily for 3 days then stop, add cetirizine, antihistamine at night.  Symptoms improved.  Now just on Flonase only.  Nocturnal cough which is a  major issue markedly improved.  She is okay with symptoms as they are now.  Still with chronic rhinorrhea etc.  Discussed role and rationale for increasing nasal regimen in the future as well as addition of ICS/LABA therapy if needed given bronchitic changes.  She expressed understanding.  Chronic rhinorrhea: Relatively stable overall.  Continue Flonase as above.  Return if symptoms worsen or fail to improve, for f/u Dr. Judeth Horn.   Karren Burly, MD 11/25/2022

## 2022-11-25 NOTE — Patient Instructions (Signed)
Nice to see you again  No change in the medications, okay to continue the Flonase as you are.  If nasal congestion and cough worsens consider increasing to twice a day for a few days to see if you can make some improvement.  If despite this in the future cough returns or is worse we can consider resuming different nasal regimens and also consider an inhaler given the mild changes of inflammation in the air tubes.  Otherwise the chest x-ray looked great.  Return to clinic with Dr. Judeth Horn as needed

## 2023-02-04 ENCOUNTER — Encounter: Payer: Self-pay | Admitting: Cardiology

## 2023-02-04 ENCOUNTER — Ambulatory Visit: Payer: Medicare Other | Attending: Cardiology | Admitting: Cardiology

## 2023-02-04 ENCOUNTER — Ambulatory Visit (INDEPENDENT_AMBULATORY_CARE_PROVIDER_SITE_OTHER): Payer: Medicare Other

## 2023-02-04 VITALS — BP 120/82 | HR 61 | Ht 62.5 in | Wt 158.0 lb

## 2023-02-04 DIAGNOSIS — I341 Nonrheumatic mitral (valve) prolapse: Secondary | ICD-10-CM | POA: Diagnosis not present

## 2023-02-04 DIAGNOSIS — R002 Palpitations: Secondary | ICD-10-CM | POA: Diagnosis not present

## 2023-02-04 DIAGNOSIS — I10 Essential (primary) hypertension: Secondary | ICD-10-CM | POA: Diagnosis not present

## 2023-02-04 NOTE — Progress Notes (Signed)
Cardiology CONSULT Note    Date:  02/04/2023   ID:  Michelle Davidson, DOB [birth year], MRN [id number]  PCP:  Aliene Beams, MD  Cardiologist:  Armanda Magic, MD   Chief Complaint  Patient presents with   New Patient (Initial Visit)    Palpitations    Patient Profile: Michelle Davidson is a 77 y.o. female who is being seen today for the evaluation of palpitations palpitations at the request of Aliene Beams, MD.  History of Present Illness:  Michelle Davidson is a 77 y.o. female who is being seen today for the evaluation of palpitations at the request of Aliene Beams, MD.  This is a 77 year old female with a fibromyalgia, hyperlipidemia, hypertension, mitral valve prolapse, PACs and PVCs who is referred for evaluation of palpitations.  She tells me that for the past month she has noticed that her heart will stop beating for a second.  She notices it mainly when in bed or quiet trying to get to sleep. She says that all of a sudden she feels like it stops.  She lays on her left side and can hear her heart beat and she will notices that it stops and she will feel like it is taking her breath away.  She has had some palpitations in the past with her MVP but this is different.  Her palpitations with MVP were associated with a flip flop of her heart which this isnt. She denies any chest pain or pressure, DOE, Dizziness or syncope.  She occasionally has some mild pedal edema at night.  She drinks 2-3 cups of decaff coffee in the am.  She drinks a few cocktails at night. Her MGF died from heart problems with does know what it was.   Past Medical History:  Diagnosis Date   Allergy    seasonal   Arthritis    Basal cell carcinoma    chest   Cataract    removed from both eyes   Choroidal neovascularization    Right eye   CNVM (choroidal neovascular membrane), right    Fibromyalgia    History of chicken pox    History of colon polyps    Hyperlipidemia    Hypertension    IBS (irritable bowel  syndrome)    Migraines    Mitral valve prolapse    PAC (premature atrial contraction)    PVC (premature ventricular contraction)     Past Surgical History:  Procedure Laterality Date   BELPHAROPTOSIS REPAIR Right 2010   BREAST BIOPSY Left 2011   Titanium chip implanted to mark the spot/benign findings   COLONOSCOPY     EXCISION METACARPAL MASS Right 01/11/2021   Procedure: RIGHT LONG FINGER EXCISION CYST AND DEBRIDEMENT DISTAL INTERPHALANGEAL JOINT;  Surgeon: Betha Loa, MD;  Location: [facility];  Service: Orthopedics;  Laterality: Right;  30 MIN   EYE SURGERY Bilateral 03/28/2018   cataract removal with lens implants. Dr.Beavis   TUBAL LIGATION  1978   WISDOM TOOTH EXTRACTION      Current Medications: Current Meds  Medication Sig   amitriptyline (ELAVIL) 25 MG tablet TAKE 1 TABLET BY MOUTH EVERYDAY AT BEDTIME   atenolol (TENORMIN) 25 MG tablet Take 1 tablet (25 mg total) by mouth 2 (two) times daily.   fluticasone (FLONASE) 50 MCG/ACT nasal spray SPRAY 1 SPRAY INTO BOTH NOSTRILS DAILY.   hyoscyamine (LEVSIN SL) 0.125 MG SL tablet Place 1 tablet (0.125 mg total) under the tongue every 4 (four)  hours as needed.   Multiple Vitamins-Minerals (CENTRUM SILVER 50+WOMEN PO) Take 1 tablet by mouth daily.   olmesartan (BENICAR) 40 MG tablet Take 40 mg by mouth daily.    Allergies:   Esomeprazole magnesium, Lipitor [atorvastatin calcium], Zocor [simvastatin], Lansoprazole, Nexium [esomeprazole magnesium], Aspirin, and Avelox [moxifloxacin hcl in nacl]   Social History   Socioeconomic History   Marital status: Married    Spouse name: Not on file   Number of children: 2   Years of education: Not on file   Highest education level: Not on file  Occupational History   Occupation: retired  Tobacco Use   Smoking status: Former    Current packs/day: 0.00    Types: Cigarettes    Quit date: 1997    Years since quitting: 27.7   Smokeless tobacco: Never  Vaping Use    Vaping status: Never Used  Substance and Sexual Activity   Alcohol use: Yes    Comment: cocktail every night   Drug use: No   Sexual activity: Not Currently    Partners: Male    Comment: 1st intercourse- 17, partners- 4, widow  Other Topics Concern   Not on file  Social History Narrative   Not on file   Social Determinants of Health   Financial Resource Strain: Low Risk  (01/26/2020)   Overall Financial Resource Strain (CARDIA)    Difficulty of Paying Living Expenses: Not hard at all  Food Insecurity: No Food Insecurity (01/26/2020)   Hunger Vital Sign    Worried About Running Out of Food in the Last Year: Never true    Ran Out of Food in the Last Year: Never true  Transportation Needs: No Transportation Needs (01/26/2020)   PRAPARE - Administrator, Civil Service (Medical): No    Lack of Transportation (Non-Medical): No  Physical Activity: Inactive (01/26/2020)   Exercise Vital Sign    Days of Exercise per Week: 0 days    Minutes of Exercise per Session: 0 min  Stress: No Stress Concern Present (01/26/2020)   [employer] of Occupational Health - Occupational Stress Questionnaire    Feeling of Stress : Not at all  Social Connections: Moderately Isolated (01/26/2020)   Social Connection and Isolation Panel [NHANES]    Frequency of Communication with Friends and Family: More than three times a week    Frequency of Social Gatherings with Friends and Family: Once a week    Attends Religious Services: Never    Database administrator or Organizations: Yes    Attends Engineer, structural: More than 4 times per year    Marital Status: Widowed     Family History:  The patient's family history includes Arthritis in her maternal grandmother and mother; Breast cancer in her cousin; Colon cancer in her father and sister; Diabetes in her paternal grandmother and paternal uncle; Heart attack in her maternal grandfather; Hyperlipidemia in her mother; Hypertension in her  mother; Kidney cancer in her paternal uncle; Lung cancer in her maternal grandfather; Skin cancer in her paternal grandfather; Uterine cancer in her paternal grandmother.   ROS:   Please see the history of present illness.    ROS All other systems reviewed and are negative.      No data to display             PHYSICAL EXAM:   VS:  BP 120/82   Pulse 61   Ht 5' 2.5" (1.588 m)   Wt 158 lb (71.7  kg)   SpO2 96%   BMI 28.44 kg/m    GEN: Well nourished, well developed, in no acute distress  HEENT: normal  Neck: no JVD, carotid bruits, or masses Cardiac: RRR; no murmurs, rubs, or gallops,no edema.  Intact distal pulses bilaterally.  Respiratory:  clear to auscultation bilaterally, normal work of breathing GI: soft, nontender, nondistended, + BS MS: no deformity or atrophy  Skin: warm and dry, no rash Neuro:  Alert and Oriented x 3, Strength and sensation are intact Psych: euthymic mood, full affect  Wt Readings from Last 3 Encounters:  02/04/23 158 lb (71.7 kg)  11/25/22 157 lb 9.6 oz (71.5 kg)  07/07/22 157 lb 9.6 oz (71.5 kg)      Studies/Labs Reviewed:   EKG Interpretation Date/Time:  Wednesday February 04 2023 10:04:11 EDT Ventricular Rate:  61 PR Interval:  170 QRS Duration:  72 QT Interval:  450 QTC Calculation: 453 R Axis:   54  Text Interpretation: Normal sinus rhythm Nonspecific T wave abnormality When compared with ECG of 07-Jan-2021 10:02, Questionable change in QRS axis Nonspecific T wave abnormality, worse in Lateral leads Confirmed by Armanda Magic ([id number]) on 02/04/2023 10:08:26 AM  EKG Interpretation Date/Time:  Wednesday February 04 2023 10:04:11 EDT Ventricular Rate:  61 PR Interval:  170 QRS Duration:  72 QT Interval:  450 QTC Calculation: 453 R Axis:   54  Text Interpretation: Normal sinus rhythm Nonspecific T wave abnormality When compared with ECG of 07-Jan-2021 10:02, Questionable change in QRS axis Nonspecific T wave abnormality, worse in  Lateral leads Confirmed by Armanda Magic ([id number]) on 02/04/2023 10:08:26 AM       Recent Labs: No results found for requested labs within last 365 days.   Lipid Panel    Component Value Date/Time   CHOL 230 (H) 01/19/2020 0926   TRIG 108.0 01/19/2020 0926   HDL 71.40 01/19/2020 0926   CHOLHDL 3 01/19/2020 0926   VLDL 21.6 01/19/2020 0926   LDLCALC 137 (H) 01/19/2020 0926     ASSESSMENT:    1. Palpitations   2. MVP (mitral valve prolapse)   3. Essential hypertension      PLAN:  In order of problems listed above:  Palpitations -she has had palpitations in the past related to her MVP that were flip flops -the palpitations she has now are that her heart stops beating and she gets a sinking feeling with her breath being caught usually at night in bed -I will get a 2 week Ziopatch to assess for tachy and bradyarrhythmias  2.  MVP -I will get a 2D echo to reassess  3.  HTN -BP controlled -continue prescription drug management with Olmesartan 40mg  daily and Atenolol 25mg  BID with PRN refills  Time Spent: 20 minutes total time of encounter, including 15 minutes spent in face-to-face patient care on the date of this encounter. This time includes coordination of care and counseling regarding above mentioned problem list. Remainder of non-face-to-face time involved reviewing chart documents/testing relevant to the patient encounter and documentation in the medical record. I have independently reviewed documentation from referring provider  Followup:  PRN  Medication Adjustments/Labs and Tests Ordered: Current medicines are reviewed at length with the patient today.  Concerns regarding medicines are outlined above.  Medication changes, Labs and Tests ordered today are listed in the Patient Instructions below.  There are no Patient Instructions on file for this visit.   Signed, Armanda Magic, MD  02/04/2023 10:24 AM    [facility] [address] [place],  [place], [place]  [address] Phone: [phone]; Fax: [phone]

## 2023-02-04 NOTE — Patient Instructions (Signed)
Medication Instructions:  Your physician recommends that you continue on your current medications as directed. Please refer to the Current Medication list given to you today.  *If you need a refill on your cardiac medications before your next appointment, please call your pharmacy*   Lab Work: None.  If you have labs (blood work) drawn today and your tests are completely normal, you will receive your results only by: MyChart Message (if you have MyChart) OR A paper copy in the mail If you have any lab test that is abnormal or we need to change your treatment, we will call you to review the results.   Testing/Procedures: Your physician has requested that you have an echocardiogram. Echocardiography is a painless test that uses sound waves to create images of your heart. It provides your doctor with information about the size and shape of your heart and how well your heart's chambers and valves are working. This procedure takes approximately one hour. There are no restrictions for this procedure. Please do NOT wear cologne, perfume, aftershave, or lotions (deodorant is allowed). Please arrive 15 minutes prior to your appointment time.   ZIO XT- Long Term Monitor Instructions  Your physician has requested you wear a ZIO patch monitor for 14 days.  This is a single patch monitor. [employer] supplies one patch monitor per enrollment. Additional stickers are not available. Please do not apply patch if you will be having a Nuclear Stress Test,  Echocardiogram, Cardiac CT, MRI, or Chest Xray during the period you would be wearing the  monitor. The patch cannot be worn during these tests. You cannot remove and re-apply the  ZIO XT patch monitor.  Your ZIO patch monitor will be mailed 3 day USPS to your address on file. It may take 3-5 days  to receive your monitor after you have been enrolled.  Once you have received your monitor, please review the enclosed instructions. Your monitor  has already  been registered assigning a specific monitor serial # to you.  Billing and Patient Assistance Program Information  We have supplied [employer] with any of your insurance information on file for billing purposes. [employer] offers a sliding scale Patient Assistance Program for patients that do not have  insurance, or whose insurance does not completely cover the cost of the ZIO monitor.  You must apply for the Patient Assistance Program to qualify for this discounted rate.  To apply, please call [employer] at [phone], select option 4, select option 2, ask to apply for  Patient Assistance Program. Meredeth Ide will ask your household income, and how many people  are in your household. They will quote your out-of-pocket cost based on that information.  [employer] will also be able to set up a 37-month, interest-free payment plan if needed.  Applying the monitor   Shave hair from upper left chest.  Hold abrader disc by orange tab. Rub abrader in 40 strokes over the upper left chest as  indicated in your monitor instructions.  Clean area with 4 enclosed alcohol pads. Let dry.  Apply patch as indicated in monitor instructions. Patch will be placed under collarbone on left  side of chest with arrow pointing upward.  Rub patch adhesive wings for 2 minutes. Remove white label marked "1". Remove the white  label marked "2". Rub patch adhesive wings for 2 additional minutes.  While looking in a mirror, press and release button in center of patch. A small green light will  flash 3-4 times. This will be your only  indicator that the monitor has been turned on.  Do not shower for the first 24 hours. You may shower after the first 24 hours.  Press the button if you feel a symptom. You will hear a small click. Record Date, Time and  Symptom in the Patient Logbook.  When you are ready to remove the patch, follow instructions on the last 2 pages of Patient  Logbook. Stick patch monitor onto the last page of Patient  Logbook.  Place Patient Logbook in the blue and white box. Use locking tab on box and tape box closed  securely. The blue and white box has prepaid postage on it. Please place it in the mailbox as  soon as possible. Your physician should have your test results approximately 7 days after the  monitor has been mailed back to [facility].  Call [employer] Customer Care at [phone] if you have questions regarding  your ZIO XT patch monitor. Call them immediately if you see an orange light blinking on your  monitor.  If your monitor falls off in less than 4 days, contact our Monitor department at [phone].  If your monitor becomes loose or falls off after 4 days call [employer] at [phone] for  suggestions on securing your monitor    Follow-Up: At [facility], you and your health needs are our priority.  As part of our continuing mission to provide you with exceptional heart care, we have created designated Provider Care Teams.  These Care Teams include your primary Cardiologist (physician) and Advanced Practice Providers (APPs -  Physician Assistants and Nurse Practitioners) who all work together to provide you with the care you need, when you need it.  We recommend signing up for the patient portal called "MyChart".  Sign up information is provided on this After Visit Summary.  MyChart is used to connect with patients for Virtual Visits (Telemedicine).  Patients are able to view lab/test results, encounter notes, upcoming appointments, etc.  Non-urgent messages can be sent to your provider as well.   To learn more about what you can do with MyChart, go to [email or web address].    Your next appointment will be dependent on the results of your testing and it will be with:     Provider:   Dr. Armanda Magic, MD

## 2023-02-04 NOTE — Progress Notes (Unsigned)
Enrolled patient for a 14 day Zio XT  monitor to be mailed to patients home  °

## 2023-02-04 NOTE — Addendum Note (Signed)
Addended by: Luellen Pucker on: 02/04/2023 10:31 AM   Modules accepted: Orders

## 2023-02-13 ENCOUNTER — Ambulatory Visit: Payer: Medicare Other | Admitting: Obstetrics and Gynecology

## 2023-02-16 DIAGNOSIS — Z23 Encounter for immunization: Secondary | ICD-10-CM | POA: Diagnosis not present

## 2023-02-18 ENCOUNTER — Encounter: Payer: Self-pay | Admitting: Cardiology

## 2023-02-20 ENCOUNTER — Ambulatory Visit (HOSPITAL_COMMUNITY): Payer: Medicare Other | Attending: Internal Medicine

## 2023-02-20 DIAGNOSIS — I340 Nonrheumatic mitral (valve) insufficiency: Secondary | ICD-10-CM

## 2023-02-20 DIAGNOSIS — I341 Nonrheumatic mitral (valve) prolapse: Secondary | ICD-10-CM | POA: Insufficient documentation

## 2023-02-20 DIAGNOSIS — R002 Palpitations: Secondary | ICD-10-CM | POA: Insufficient documentation

## 2023-02-20 LAB — ECHOCARDIOGRAM COMPLETE
Area-P 1/2: 2.62 cm2
S' Lateral: 3.5 cm

## 2023-02-23 ENCOUNTER — Encounter: Payer: Self-pay | Admitting: Cardiology

## 2023-02-23 ENCOUNTER — Telehealth: Payer: Self-pay

## 2023-02-23 DIAGNOSIS — I351 Nonrheumatic aortic (valve) insufficiency: Secondary | ICD-10-CM | POA: Insufficient documentation

## 2023-02-23 NOTE — Telephone Encounter (Signed)
-----   Message from Armanda Magic sent at 02/23/2023  9:10 AM EDT ----- 2D echo showed normal pumping function of the heart with increased stiffness of the heart when it tries to relax to fill with blood called grade 1 diastolic dysfunction.  This occurs with aging.  The left atrium is enlarged some and there is mild leakiness of the mitral valve.  There is also mild leakiness of the aortic valve.

## 2023-02-23 NOTE — Telephone Encounter (Signed)
Called patient to review 2D echo which showed normal pumping function of the heart with increased stiffness of the heart. This occurs with aging. Patient verbalizes understanding the left atrium is enlarged some and there is mild leakiness of the mitral valve as well as mild leakiness of the aortic valve.

## 2023-02-27 DIAGNOSIS — I341 Nonrheumatic mitral (valve) prolapse: Secondary | ICD-10-CM | POA: Diagnosis not present

## 2023-02-27 DIAGNOSIS — R002 Palpitations: Secondary | ICD-10-CM | POA: Diagnosis not present

## 2023-03-04 ENCOUNTER — Telehealth: Payer: Self-pay

## 2023-03-04 DIAGNOSIS — Z79899 Other long term (current) drug therapy: Secondary | ICD-10-CM

## 2023-03-04 MED ORDER — ATENOLOL 50 MG PO TABS: 50.0000 mg | ORAL_TABLET | Freq: Two times a day (BID) | ORAL | 3 refills | Status: DC

## 2023-03-04 NOTE — Telephone Encounter (Signed)
-----   Message from Armanda Magic sent at 03/03/2023  8:30 PM EST ----- Heart monitor showed runs of fast heart beat from the top of the heart called atrial tachycardia (SVT) and PACs. There were some extra heart beats from the bottom of the heart called PVCs.  Increase Atenolol to 50mg  BID and followup with extender in 2 weeks.  Have her come in for BMET and TSH.

## 2023-03-04 NOTE — Telephone Encounter (Signed)
Called patient to discuss Heart monitor results, no asnwer, left message per [facility] asking patient to call our office to discuss.

## 2023-03-04 NOTE — Telephone Encounter (Signed)
Call to patient to discuss heart monitor results which showed runs of fatrial tachycardia (SVT) and PACs as well as  PVCs.  Patient agrees to increase Atenolol to 50mg  BID and followup with extender in 2 weeks, appt made for 03/19/23.   BMET and TSH ordered.

## 2023-03-06 ENCOUNTER — Other Ambulatory Visit: Payer: Self-pay | Admitting: *Deleted

## 2023-03-06 DIAGNOSIS — Z79899 Other long term (current) drug therapy: Secondary | ICD-10-CM | POA: Diagnosis not present

## 2023-03-07 LAB — BASIC METABOLIC PANEL
BUN/Creatinine Ratio: 15 (ref 12–28)
BUN: 13 mg/dL (ref 8–27)
CO2: 27 mmol/L (ref 20–29)
Calcium: 10.1 mg/dL (ref 8.7–10.3)
Chloride: 104 mmol/L (ref 96–106)
Creatinine, Ser: 0.84 mg/dL (ref 0.57–1.00)
Glucose: 102 mg/dL — ABNORMAL HIGH (ref 70–99)
Potassium: 4.4 mmol/L (ref 3.5–5.2)
Sodium: 143 mmol/L (ref 134–144)
eGFR: 71 mL/min/{1.73_m2} (ref >59)

## 2023-03-07 LAB — TSH: TSH: 0.987 u[IU]/mL (ref 0.450–4.500)

## 2023-03-16 ENCOUNTER — Telehealth: Payer: Self-pay

## 2023-03-16 NOTE — Telephone Encounter (Signed)
Patient verbalizes understanding of normal labs.

## 2023-03-16 NOTE — Telephone Encounter (Signed)
-----   Message from Armanda Magic sent at 03/08/2023  6:09 PM EST ----- Regarding: FW: Normal labs ----- Message ----- From: Nell Range Lab Results In Sent: 03/07/2023   2:36 AM EST To: Quintella Reichert, MD

## 2023-03-18 NOTE — Progress Notes (Unsigned)
Cardiology Office Note:  .   Date:  03/19/2023  ID:  TARISA RADI, DOB [birth year], MRN [id number] PCP: Aliene Beams, MD  [facility] Providers Cardiologist:  None    Patient Profile: .      PMH Palpitations PVCs and PACs Fibromyalgia Hypertension Hyperlipidemia Mitral valve prolapse Statin intolerance to atorvastatin and simvastatin  Referred to cardiology and seen by Dr. Mayford Knife for palpitations on 02/04/2023.  She reported for the past month she had noticed that her heart would stop beating for a second.  Notices this most frequently when in bed or lying quietly trying to go to sleep.  Feels that all of a sudden it stops.  When lying on her left side she can hear her heartbeat and notices that it stops and she has the sensation of it taking her breath away.  Prior palpitations associated with MVP were associated with a flip-flop in her heart, this feels different.  She did not chest pain or pressure or DOE.  Occasionally has mild pedal edema at night.  Drinks 2 to 3 cups of decaf coffee in the mornings and a few cocktails at night.  Her maternal grandfather died from heart problems but she does not know what they were.  She was taking it Synalar 25 mg twice daily and olmesartan 40 mg daily for hypertension.  Cardiac testing ordered for evaluation.   TTE 02/20/23 revealed LVEF 55 to 60%, G1 DD, normal RV, no evidence of MVP, mild MR, mild calcification of the aortic valve without any evidence of stenosis, mild AI, borderline dilatation of the aortic root measuring 38 mm, borderline dilatation of ascending aorta measuring 38 mm.  13-day ZIO revealed predominant rhythm normal sinus rhythm with average HR 65 bpm, multiple episodes of SVT with longest lasting 10.3 seconds and correlate with patient's symptoms, rare PVCs and PACs. She was advised to increase atenolol to 50 mg BID. TSH was normal. BMET did not reveal any concerning abnormalities.       History of Present Illness: .    VEVA MALEK is a very pleasant 77 y.o. female who is here today for follow-up. She reports that as soon as she took off the monitor, she had more episodes of feeling her heart stop. This has not improved despite an increased dose of atenolol. She describes these palpitations as a sensation of the heart stopping, which occurs sporadically. No associated symptoms of lightheadedness, presyncope, syncope or shortness of breath. She does not like taking medications. Home BP has been around 126/70. She reviewed home readings with PCP who felt it was well controlled. She admits to a lack of regular exercise but expresses a willingness to start. She lives at [facility] and has access to a gym and classes.  She reports significant facial flushing with exercise but this does not make her feel poorly. She denies orthopnea, PND, edema, dyspnea, presyncope or syncope.   Discussed the use of AI scribe software for clinical note transcription with the patient, who gave verbal consent to proceed.    ROS: + palpitations       Studies Reviewed: .        Risk Assessment/Calculations:             Physical Exam:   VS:  BP 132/78   Pulse (!) 58   Ht 5' 2.5" (1.588 m)   Wt 156 lb 3.2 oz (70.9 kg)   SpO2 96%   BMI 28.11 kg/m    Wt Readings  from Last 3 Encounters:  03/19/23 156 lb 3.2 oz (70.9 kg)  02/04/23 158 lb (71.7 kg)  11/25/22 157 lb 9.6 oz (71.5 kg)    GEN: Well nourished, well developed in no acute distress NECK: No JVD; No carotid bruits CARDIAC: Irregular RR, no murmurs, rubs, gallops RESPIRATORY:  Clear to auscultation without rales, wheezing or rhonchi  ABDOMEN: Soft, non-tender, non-distended EXTREMITIES:  No edema; No deformity     ASSESSMENT AND PLAN: .    Palpitations/PVCs/SVT: She reports frequent palpitations noted as sensation of skipped beats. No improvement with increased dose of Atenolol. No significant findings on recent monitoring. Mild mitral regurgitation noted on echo,  but no prolapse. Home monitoring options reviewed including Kardia monitor, Apple watch, BP monitor and pulse oximeter. Advised that ILR is an option but since she is not experiencing concerning symptoms, she may want to consider home monitoring options first. Advised we could try alternative medication to Atenolol. Encourage regular exercise and healthy diet/hydration as it may improve PVCs.  She would like to continue to monitor at home and notify us if symptoms worsen. Consider changing BB if symptoms worsen.   Hypertension: BP initially elevated but improved upon my recheck.  Recent BP log reviewed with PCP was satisfactory.  Renal function stable on labs completed 03/06/2023.  We will continue atenolol and olmesartan.  Valve disease: Echo 02/20/23 revealed normal structure of mitral valve, mild MR, mild aortic valve calcification without evidence of stenosis, mild AI  Dyslipidemia: Lipid panel 10/07/2022 reveals total cholesterol 241, HDL 67, LDL 150, and triglycerides 136. Previously tried simvastatin and atorvastatin which caused muscle aches.  ASCVD risk score is 30.3%. We discussed further risk stratification with CT calcium score.  She would like to proceed.  She will consider low-dose rosuvastatin or non-statin lipid lowering therapy once results of CT are available.         Dispo: 6 months with Dr. Mayford Knife or me  Signed, Eligha Bridegroom, NP-C

## 2023-03-19 ENCOUNTER — Encounter: Payer: Self-pay | Admitting: Nurse Practitioner

## 2023-03-19 ENCOUNTER — Ambulatory Visit: Payer: Medicare Other | Attending: Nurse Practitioner | Admitting: Nurse Practitioner

## 2023-03-19 VITALS — BP 132/78 | HR 58 | Ht 62.5 in | Wt 156.2 lb

## 2023-03-19 DIAGNOSIS — I471 Supraventricular tachycardia, unspecified: Secondary | ICD-10-CM | POA: Diagnosis not present

## 2023-03-19 DIAGNOSIS — I10 Essential (primary) hypertension: Secondary | ICD-10-CM | POA: Diagnosis not present

## 2023-03-19 DIAGNOSIS — R002 Palpitations: Secondary | ICD-10-CM

## 2023-03-19 DIAGNOSIS — I340 Nonrheumatic mitral (valve) insufficiency: Secondary | ICD-10-CM | POA: Diagnosis not present

## 2023-03-19 DIAGNOSIS — I358 Other nonrheumatic aortic valve disorders: Secondary | ICD-10-CM

## 2023-03-19 DIAGNOSIS — I493 Ventricular premature depolarization: Secondary | ICD-10-CM | POA: Diagnosis not present

## 2023-03-19 NOTE — Patient Instructions (Signed)
Medication Instructions:   Your physician recommends that you continue on your current medications as directed. Please refer to the Current Medication list given to you today.   *If you need a refill on your cardiac medications before your next appointment, please call your pharmacy*   Lab Work:  None ordered.  If you have labs (blood work) drawn today and your tests are completely normal, you will receive your results only by: MyChart Message (if you have MyChart) OR A paper copy in the mail If you have any lab test that is abnormal or we need to change your treatment, we will call you to review the results.   Testing/Procedures:  Michelle Davidson has ordered a CT coronary calcium score.   Test locations:  [facility]  This is $99 out of pocket.   Coronary CalciumScan A coronary calcium scan is an imaging test used to look for deposits of calcium and other fatty materials (plaques) in the inner lining of the blood vessels of the heart (coronary arteries). These deposits of calcium and plaques can partly clog and narrow the coronary arteries without producing any symptoms or warning signs. This puts a person at risk for a heart attack. This test can detect these deposits before symptoms develop. Tell a health care provider about: Any allergies you have. All medicines you are taking, including vitamins, herbs, eye drops, creams, and over-the-counter medicines. Any problems you or family members have had with anesthetic medicines. Any blood disorders you have. Any surgeries you have had. Any medical conditions you have. Whether you are pregnant or may be pregnant. What are the risks? Generally, this is a safe procedure. However, problems may occur, including: Harm to a pregnant woman and her unborn baby. This test involves the use of radiation. Radiation exposure can be dangerous to a pregnant woman and her unborn baby. If you are pregnant, you generally should not have this  procedure done. Slight increase in the risk of cancer. This is because of the radiation involved in the test. What happens before the procedure? No preparation is needed for this procedure. What happens during the procedure? You will undress and remove any jewelry around your neck or chest. You will put on a hospital gown. Sticky electrodes will be placed on your chest. The electrodes will be connected to an electrocardiogram (ECG) machine to record a tracing of the electrical activity of your heart. A CT scanner will take pictures of your heart. During this time, you will be asked to lie still and hold your breath for 2-3 seconds while a picture of your heart is being taken. The procedure may vary among health care providers and hospitals. What happens after the procedure? You can get dressed. You can return to your normal activities. It is up to you to get the results of your test. Ask your health care provider, or the department that is doing the test, when your results will be ready. Summary A coronary calcium scan is an imaging test used to look for deposits of calcium and other fatty materials (plaques) in the inner lining of the blood vessels of the heart (coronary arteries). Generally, this is a safe procedure. Tell your health care provider if you are pregnant or may be pregnant. No preparation is needed for this procedure. A CT scanner will take pictures of your heart. You can return to your normal activities after the scan is done. This information is not intended to replace advice given to you by your health  care provider. Make sure you discuss any questions you have with your health care provider. Document Released: 10/11/2007 Document Revised: 03/03/2016 Document Reviewed: 03/03/2016 Elsevier Interactive Patient Education  2017 [employer].    Follow-Up: At [facility], you and your health needs are our priority.  As part of our continuing mission to provide you  with exceptional heart care, we have created designated Provider Care Teams.  These Care Teams include your primary Cardiologist (physician) and Advanced Practice Providers (APPs -  Physician Assistants and Nurse Practitioners) who all work together to provide you with the care you need, when you need it.  We recommend signing up for the patient portal called "MyChart".  Sign up information is provided on this After Visit Summary.  MyChart is used to connect with patients for Virtual Visits (Telemedicine).  Patients are able to view lab/test results, encounter notes, upcoming appointments, etc.  Non-urgent messages can be sent to your provider as well.   To learn more about what you can do with MyChart, go to [email or web address].    Your next appointment:   6 month(s)  Provider:   Estevan Oaks or Michelle Davidson    Other Instructions  Your physician wants you to follow-up in: 6 months.  You will receive a reminder letter in the mail two months in advance. If you don't receive a letter, please call our office to schedule the follow-up appointment.

## 2023-04-01 ENCOUNTER — Other Ambulatory Visit (HOSPITAL_COMMUNITY): Payer: Medicare Other

## 2023-04-10 DIAGNOSIS — R7303 Prediabetes: Secondary | ICD-10-CM | POA: Diagnosis not present

## 2023-04-10 DIAGNOSIS — I341 Nonrheumatic mitral (valve) prolapse: Secondary | ICD-10-CM | POA: Diagnosis not present

## 2023-04-10 DIAGNOSIS — E782 Mixed hyperlipidemia: Secondary | ICD-10-CM | POA: Diagnosis not present

## 2023-04-10 DIAGNOSIS — Z23 Encounter for immunization: Secondary | ICD-10-CM | POA: Diagnosis not present

## 2023-04-10 DIAGNOSIS — I10 Essential (primary) hypertension: Secondary | ICD-10-CM | POA: Diagnosis not present

## 2023-05-04 ENCOUNTER — Other Ambulatory Visit (HOSPITAL_COMMUNITY): Payer: Medicare Other

## 2023-05-08 ENCOUNTER — Other Ambulatory Visit (HOSPITAL_COMMUNITY): Payer: Medicare Other

## 2023-05-19 ENCOUNTER — Ambulatory Visit (HOSPITAL_COMMUNITY)
Admission: RE | Admit: 2023-05-19 | Discharge: 2023-05-19 | Disposition: A | Discharge status: Home / Self Care | Payer: Self-pay | Source: Ambulatory Visit | Attending: Nurse Practitioner | Admitting: Nurse Practitioner

## 2023-05-19 DIAGNOSIS — R002 Palpitations: Secondary | ICD-10-CM | POA: Insufficient documentation

## 2023-05-19 DIAGNOSIS — I340 Nonrheumatic mitral (valve) insufficiency: Secondary | ICD-10-CM | POA: Insufficient documentation

## 2023-05-19 DIAGNOSIS — I10 Essential (primary) hypertension: Secondary | ICD-10-CM | POA: Insufficient documentation

## 2023-05-19 DIAGNOSIS — I493 Ventricular premature depolarization: Secondary | ICD-10-CM | POA: Insufficient documentation

## 2023-05-19 DIAGNOSIS — I471 Supraventricular tachycardia, unspecified: Secondary | ICD-10-CM | POA: Insufficient documentation

## 2023-05-20 DIAGNOSIS — H4422 Degenerative myopia, left eye: Secondary | ICD-10-CM | POA: Diagnosis not present

## 2023-05-20 DIAGNOSIS — H43811 Vitreous degeneration, right eye: Secondary | ICD-10-CM | POA: Diagnosis not present

## 2023-05-20 DIAGNOSIS — H43823 Vitreomacular adhesion, bilateral: Secondary | ICD-10-CM | POA: Diagnosis not present

## 2023-05-20 DIAGNOSIS — H442E1 Degenerative myopia with other maculopathy, right eye: Secondary | ICD-10-CM | POA: Diagnosis not present

## 2023-05-20 DIAGNOSIS — H3581 Retinal edema: Secondary | ICD-10-CM | POA: Diagnosis not present

## 2023-05-20 DIAGNOSIS — H43393 Other vitreous opacities, bilateral: Secondary | ICD-10-CM | POA: Diagnosis not present

## 2023-05-29 ENCOUNTER — Other Ambulatory Visit: Payer: Self-pay | Admitting: Medical Genetics

## 2023-06-03 DIAGNOSIS — R931 Abnormal findings on diagnostic imaging of heart and coronary circulation: Secondary | ICD-10-CM | POA: Diagnosis not present

## 2023-06-03 DIAGNOSIS — I10 Essential (primary) hypertension: Secondary | ICD-10-CM | POA: Diagnosis not present

## 2023-06-03 DIAGNOSIS — I2510 Atherosclerotic heart disease of native coronary artery without angina pectoris: Secondary | ICD-10-CM | POA: Diagnosis not present

## 2023-06-03 DIAGNOSIS — I2089 Other forms of angina pectoris: Secondary | ICD-10-CM | POA: Diagnosis not present

## 2023-06-03 DIAGNOSIS — E7800 Pure hypercholesterolemia, unspecified: Secondary | ICD-10-CM | POA: Diagnosis not present

## 2023-06-03 DIAGNOSIS — R002 Palpitations: Secondary | ICD-10-CM | POA: Diagnosis not present

## 2023-06-12 ENCOUNTER — Other Ambulatory Visit (HOSPITAL_COMMUNITY)
Admission: RE | Admit: 2023-06-12 | Discharge: 2023-06-12 | Disposition: A | Discharge status: Home / Self Care | Payer: Self-pay | Source: Ambulatory Visit | Attending: Medical Genetics | Admitting: Medical Genetics

## 2023-06-15 ENCOUNTER — Encounter: Payer: Self-pay | Admitting: Cardiology

## 2023-06-17 ENCOUNTER — Encounter: Payer: Self-pay | Admitting: Cardiology

## 2023-06-17 ENCOUNTER — Ambulatory Visit: Payer: Medicare Other | Attending: Cardiology | Admitting: Cardiology

## 2023-06-17 VITALS — BP 134/78 | HR 60 | Ht 62.5 in | Wt 155.0 lb

## 2023-06-17 DIAGNOSIS — I340 Nonrheumatic mitral (valve) insufficiency: Secondary | ICD-10-CM | POA: Insufficient documentation

## 2023-06-17 DIAGNOSIS — I10 Essential (primary) hypertension: Secondary | ICD-10-CM | POA: Insufficient documentation

## 2023-06-17 DIAGNOSIS — I471 Supraventricular tachycardia, unspecified: Secondary | ICD-10-CM | POA: Diagnosis not present

## 2023-06-17 DIAGNOSIS — R002 Palpitations: Secondary | ICD-10-CM | POA: Insufficient documentation

## 2023-06-17 DIAGNOSIS — R931 Abnormal findings on diagnostic imaging of heart and coronary circulation: Secondary | ICD-10-CM | POA: Insufficient documentation

## 2023-06-17 NOTE — Patient Instructions (Signed)
Medication Instructions:  Your physician recommends that you continue on your current medications as directed. Please refer to the Current Medication list given to you today. *If you need a refill on your cardiac medications before your next appointment, please call your pharmacy*   Lab Work: BMET today - please have this completed at [facility] on the first floor of our building  If you have labs (blood work) drawn today and your tests are completely normal, you will receive your results only by: MyChart Message (if you have MyChart) OR A paper copy in the mail If you have any lab test that is abnormal or we need to change your treatment, we will call you to review the results.   Testing/Procedures: Coronary CTA - someone from the hospital will contact you to set this up Your physician has requested that you have cardiac CT. Cardiac computed tomography (CT) is a painless test that uses an x-ray machine to take clear, detailed pictures of your heart. For further information please visit [email or web address] Please follow instruction sheet as given.   Follow-Up: At [facility], you and your health needs are our priority.  As part of our continuing mission to provide you with exceptional heart care, we have created designated Provider Care Teams.  These Care Teams include your primary Cardiologist (physician) and Advanced Practice Providers (APPs -  Physician Assistants and Nurse Practitioners) who all work together to provide you with the care you need, when you need it.  We recommend signing up for the patient portal called "MyChart".  Sign up information is provided on this After Visit Summary.  MyChart is used to connect with patients for Virtual Visits (Telemedicine).  Patients are able to view lab/test results, encounter notes, upcoming appointments, etc.  Non-urgent messages can be sent to your provider as well.   To learn more about what you can do with MyChart, go to  [email or web address].    Your next appointment:   1 year(s)  Provider:   Dr Mayford Knife    Your cardiac CT will be scheduled at:   [facility] [address] [place], [place] [address] [phone]  Please arrive at the [facility] and Children's Entrance (Entrance C2) of [facility] 30 minutes prior to test start time. You can use the FREE valet parking offered at entrance C (encouraged to control the heart rate for the test)  Proceed to the [facility] Radiology Department (first floor) to check-in and test prep.  All radiology patients and guests should use entrance C2 at [facility], accessed from [facility], even though the hospital's physical address listed is [address]    Please follow these instructions carefully (unless otherwise directed):  An IV will be required for this test and Nitroglycerin will be given.  Hold all erectile dysfunction medications at least 3 days (72 hrs) prior to test. (Ie viagra, cialis, sildenafil, tadalafil, etc)   On the Night Before the Test: Be sure to Drink plenty of water. Do not consume any caffeinated/decaffeinated beverages or chocolate 12 hours prior to your test. Do not take any antihistamines 12 hours prior to your test.  On the Day of the Test: Drink plenty of water until 1 hour prior to the test. Do not eat any food 1 hour prior to test. You may take your regular medications prior to the test.  If you take Furosemide/Hydrochlorothiazide/Spironolactone/Chlorthalidone, please HOLD on the morning of the test. Patients who wear a continuous glucose monitor  MUST remove the device prior to scanning. FEMALES- please wear underwire-free bra if available, avoid dresses & tight clothing      After the Test: Drink plenty of water. After receiving IV contrast, you may experience a mild flushed feeling. This is normal. On occasion, you may experience a mild rash up to 24 hours after the  test. This is not dangerous. If this occurs, you can take Benadryl 25 mg, Zyrtec, Claritin, or Allegra and increase your fluid intake. (Patients taking Tikosyn should avoid Benadryl, and may take Zyrtec, Claritin, or Allegra) If you experience trouble breathing, this can be serious. If it is severe call 911 IMMEDIATELY. If it is mild, please call our office.  We will call to schedule your test 2-4 weeks out understanding that some insurance companies will need an authorization prior to the service being performed.   For more information and frequently asked questions, please visit our website : [email or web address]  For non-scheduling related questions, please contact the cardiac imaging nurse navigator should you have any questions/concerns: Cardiac Imaging Nurse Navigators Direct Office Dial: [phone]   For scheduling needs, including cancellations and rescheduling, please call [place], [phone].

## 2023-06-17 NOTE — Progress Notes (Signed)
Cardiology Note    Date:  06/17/2023   ID:  VERTIE DIBBERN, DOB 05/02/1945, MRN [id number]  PCP:  Aliene Beams, MD  Cardiologist:  Armanda Magic, MD   Chief Complaint  Patient presents with   Palpitations   Mitral Regurgitation   Aortic Insuffiency   Hypertension   Coronary Artery Disease    History of Present Illness:  Michelle Davidson is a 78 y.o. female  with a history fibromyalgia, hyperlipidemia, hypertension, mitral valve prolapse/MR, PACs and PVCs who was initially referred for evaluation of palpitations.    She wore a heart monitor 03/02/2023 showing multiple episodes of SVT with the longest lasting 10.3 seconds at a heart rate of 193 bpm.  There were rare PACs and PVCs.  Episodes of SVT corresponded to patient's complaints of palpitations.  Her atenolol increased to 50 mg twice daily.  2D echo 02/20/2023 showed EF 55 to 60% with G1 DD, moderate LAE, mild MR and AR.  She also underwent a coronary calcium score 05/19/2023 that was as  469 which was 81st percentile for age, race and sex matched controls.  She is here today for followup and is doing well. She is frustrated that she has been having palpitations when she lays down to go to sleep. She will feel her heart beating and then feels like it stops.  She gets scared with this and feels it is very annoying.  She denies any palpitations during the day.  She has noticed some DOE when she tries to walk with her husband who walks very fast. She lives at [facility] and when she walks over the the dining hall she is completely out of breath.She has a hx of tobacco use in the past but quit 30 years ago. She denies any chest pain or pressure, PND, orthopnea, LE edema, dizziness,  or syncope. She is compliant with her meds and is tolerating meds with no SE.    Past Medical History:  Diagnosis Date   Agatston coronary artery calcium score greater than 400    Cor CAL score 469 on 05/18/2023   Allergy    seasonal   Aortic  insufficiency    Mild by echo 01/2023   Arthritis    Basal cell carcinoma    chest   Cataract    removed from both eyes   Choroidal neovascularization    Right eye   CNVM (choroidal neovascular membrane), right    Fibromyalgia    History of chicken pox    History of colon polyps    Hyperlipidemia    Hypertension    IBS (irritable bowel syndrome)    Migraines    Mitral valve prolapse    Mild MR by echo 02/15/2023   PAC (premature atrial contraction)    PVC (premature ventricular contraction)     Past Surgical History:  Procedure Laterality Date   BELPHAROPTOSIS REPAIR Right 2010   BREAST BIOPSY Left 2011   Titanium chip implanted to mark the spot/benign findings   COLONOSCOPY     EXCISION METACARPAL MASS Right 01/11/2021   Procedure: RIGHT LONG FINGER EXCISION CYST AND DEBRIDEMENT DISTAL INTERPHALANGEAL JOINT;  Surgeon: Betha Loa, MD;  Location: [facility];  Service: Orthopedics;  Laterality: Right;  30 MIN   EYE SURGERY Bilateral 03/28/2018   cataract removal with lens implants. Dr.Beavis   TUBAL LIGATION  1978   WISDOM TOOTH EXTRACTION      Current Medications: Current Meds  Medication Sig  amitriptyline (ELAVIL) 25 MG tablet TAKE 1 TABLET BY MOUTH EVERYDAY AT BEDTIME   atenolol (TENORMIN) 50 MG tablet Take 1 tablet (50 mg total) by mouth 2 (two) times daily.   fluticasone (FLONASE) 50 MCG/ACT nasal spray SPRAY 1 SPRAY INTO BOTH NOSTRILS DAILY.   Multiple Vitamins-Minerals (CENTRUM SILVER 50+WOMEN PO) Take 1 tablet by mouth daily.   olmesartan (BENICAR) 40 MG tablet Take 40 mg by mouth daily.   Polyethyl Glycol-Propyl Glycol 0.4-0.3 % SOLN Place into both eyes in the morning and at bedtime.   rosuvastatin (CRESTOR) 5 MG tablet 1 tablet Orally Once a day for 90 days    Allergies:   Esomeprazole magnesium, Lipitor [atorvastatin calcium], Zocor [simvastatin], Lansoprazole, Nexium [esomeprazole magnesium], Aspirin, and Avelox [moxifloxacin hcl in nacl]    Social History   Socioeconomic History   Marital status: Married    Spouse name: Not on file   Number of children: 2   Years of education: Not on file   Highest education level: Not on file  Occupational History   Occupation: retired  Tobacco Use   Smoking status: Former    Current packs/day: 0.00    Types: Cigarettes    Quit date: 1997    Years since quitting: 28.1   Smokeless tobacco: Never  Vaping Use   Vaping status: Never Used  Substance and Sexual Activity   Alcohol use: Yes    Comment: cocktail every night   Drug use: No   Sexual activity: Not Currently    Partners: Male    Comment: 1st intercourse- 17, partners- 4, widow  Other Topics Concern   Not on file  Social History Narrative   Not on file   Social Drivers of Health   Financial Resource Strain: Low Risk  (01/26/2020)   Overall Financial Resource Strain (CARDIA)    Difficulty of Paying Living Expenses: Not hard at all  Food Insecurity: No Food Insecurity (01/26/2020)   Hunger Vital Sign    Worried About Running Out of Food in the Last Year: Never true    Ran Out of Food in the Last Year: Never true  Transportation Needs: No Transportation Needs (01/26/2020)   PRAPARE - Administrator, Civil Service (Medical): No    Lack of Transportation (Non-Medical): No  Physical Activity: Inactive (01/26/2020)   Exercise Vital Sign    Days of Exercise per Week: 0 days    Minutes of Exercise per Session: 0 min  Stress: No Stress Concern Present (01/26/2020)   [employer] of Occupational Health - Occupational Stress Questionnaire    Feeling of Stress : Not at all  Social Connections: Moderately Isolated (01/26/2020)   Social Connection and Isolation Panel [NHANES]    Frequency of Communication with Friends and Family: More than three times a week    Frequency of Social Gatherings with Friends and Family: Once a week    Attends Religious Services: Never    Database administrator or Organizations: Yes     Attends Engineer, structural: More than 4 times per year    Marital Status: Widowed     Family History:  The patient's family history includes Arthritis in her maternal grandmother and mother; Breast cancer in her cousin; Colon cancer in her father and sister; Diabetes in her paternal grandmother and paternal uncle; Heart attack in her maternal grandfather; Hyperlipidemia in her mother; Hypertension in her mother; Kidney cancer in her paternal uncle; Lung cancer in her maternal grandfather; Skin cancer in  her paternal grandfather; Uterine cancer in her paternal grandmother.   ROS:   Please see the history of present illness.    ROS All other systems reviewed and are negative.      No data to display             PHYSICAL EXAM:   VS:  BP 134/78   Pulse 60   Ht 5' 2.5" (1.588 m)   Wt 155 lb (70.3 kg)   BMI 27.90 kg/m    GEN: Well nourished, well developed in no acute distress HEENT: Normal NECK: No JVD; No carotid bruits LYMPHATICS: No lymphadenopathy CARDIAC:RRR, no murmurs, rubs, gallops RESPIRATORY:  Clear to auscultation without rales, wheezing or rhonchi  ABDOMEN: Soft, non-tender, non-distended MUSCULOSKELETAL:  No edema; No deformity  SKIN: Warm and dry NEUROLOGIC:  Alert and oriented x 3 PSYCHIATRIC:  Normal affect  Wt Readings from Last 3 Encounters:  06/17/23 155 lb (70.3 kg)  03/19/23 156 lb 3.2 oz (70.9 kg)  02/04/23 158 lb (71.7 kg)      Studies/Labs Reviewed:          Cardiac Studies & Procedures   ______________________________________________________________________________________________     ECHOCARDIOGRAM  ECHOCARDIOGRAM COMPLETE 02/20/2023  Narrative ECHOCARDIOGRAM REPORT    Patient Name:   Michelle Davidson Date of Exam: 02/20/2023 Medical Rec #:  [id number]       Height:       62.5 in Accession #:    [id number]      Weight:       158.0 lb Date of Birth:  1945/09/26       BSA:          1.740 m Patient Age:    77 years         BP:           120/82 mmHg Patient Gender: F               HR:           65 bpm. Exam Location:  [address]  Procedure: 2D Echo, Cardiac Doppler, Color Doppler and 3D Echo  Indications:    Mitral Valve Disorder, other non-rheumatic I34.2; Mitral Valve Prolapse.  History:        Patient has no prior history of Echocardiogram examinations. Arrythmias:PAC, Signs/Symptoms:Palpitations; Risk Factors:Hypertension and Dyslipidemia.  Sonographer:    Thurman Coyer RDCS Referring Phys: [phone] Tieara Flitton R Dempsey Ahonen  IMPRESSIONS   1. Left ventricular ejection fraction, by estimation, is 55 to 60%. The left ventricle has normal function. The left ventricle has no regional wall motion abnormalities. Left ventricular diastolic parameters are consistent with Grade I diastolic dysfunction (impaired relaxation). 2. Right ventricular systolic function is normal. The right ventricular size is normal. 3. Left atrial size was moderately dilated. 4. The mitral valve is normal in structure. Mild mitral valve regurgitation. No evidence of mitral stenosis. 5. The aortic valve is tricuspid. There is mild calcification of the aortic valve. Aortic valve regurgitation is mild. Aortic valve sclerosis/calcification is present, without any evidence of aortic stenosis. 6. Aortic dilatation noted. There is borderline dilatation of the aortic root, measuring 38 mm. There is borderline dilatation of the ascending aorta, measuring 38 mm. 7. The inferior vena cava is normal in size with greater than 50% respiratory variability, suggesting right atrial pressure of 3 mmHg.  FINDINGS Left Ventricle: Left ventricular ejection fraction, by estimation, is 55 to 60%. The left ventricle has normal function. The left ventricle has no regional wall motion  abnormalities. The left ventricular internal cavity size was normal in size. There is no left ventricular hypertrophy. Left ventricular diastolic parameters are consistent with Grade  I diastolic dysfunction (impaired relaxation).  Right Ventricle: The right ventricular size is normal. No increase in right ventricular wall thickness. Right ventricular systolic function is normal.  Left Atrium: Left atrial size was moderately dilated.  Right Atrium: Right atrial size was normal in size.  Pericardium: There is no evidence of pericardial effusion.  Mitral Valve: The mitral valve is normal in structure. Mild mitral annular calcification. Mild mitral valve regurgitation. No evidence of mitral valve stenosis.  Tricuspid Valve: The tricuspid valve is normal in structure. Tricuspid valve regurgitation is mild . No evidence of tricuspid stenosis.  Aortic Valve: The aortic valve is tricuspid. There is mild calcification of the aortic valve. Aortic valve regurgitation is mild. Aortic valve sclerosis/calcification is present, without any evidence of aortic stenosis.  Pulmonic Valve: The pulmonic valve was normal in structure. Pulmonic valve regurgitation is mild. No evidence of pulmonic stenosis.  Aorta: Aortic dilatation noted. There is borderline dilatation of the aortic root, measuring 38 mm. There is borderline dilatation of the ascending aorta, measuring 38 mm.  Venous: The inferior vena cava is normal in size with greater than 50% respiratory variability, suggesting right atrial pressure of 3 mmHg.  IAS/Shunts: No atrial level shunt detected by color flow Doppler.   LEFT VENTRICLE PLAX 2D LVIDd:         4.80 cm   Diastology LVIDs:         3.50 cm   LV e' medial:    5.35 cm/s LV PW:         1.00 cm   LV E/e' medial:  11.7 LV IVS:        0.90 cm   LV e' lateral:   9.57 cm/s LVOT diam:     2.20 cm   LV E/e' lateral: 6.5 LV SV:         79 LV SV Index:   45 LVOT Area:     3.80 cm  3D Volume EF: 3D EF:        67 % LV EDV:       128 ml LV ESV:       42 ml LV SV:        86 ml  RIGHT VENTRICLE             IVC RV Basal diam:  3.40 cm     IVC diam: 1.10 cm RV Mid diam:     3.00 cm RV S prime:     10.40 cm/s TAPSE (M-mode): 2.0 cm  LEFT ATRIUM             Index        RIGHT ATRIUM           Index LA diam:        4.10 cm 2.36 cm/m   RA Area:     14.20 cm LA Vol (A2C):   89.6 ml 51.50 ml/m  RA Volume:   31.30 ml  17.99 ml/m LA Vol (A4C):   53.9 ml 30.98 ml/m LA Biplane Vol: 69.2 ml 39.77 ml/m AORTIC VALVE LVOT Vmax:   89.10 cm/s LVOT Vmean:  58.200 cm/s LVOT VTI:    0.207 m  AORTA Ao Root diam: 3.80 cm Ao Asc diam:  3.80 cm  MITRAL VALVE MV Area (PHT): 2.62 cm    SHUNTS MV Decel Time: 289 msec  Systemic VTI:  0.21 m MV E velocity: 62.50 cm/s  Systemic Diam: 2.20 cm MV A velocity: 65.10 cm/s MV E/A ratio:  0.96  Arvilla Meres MD Electronically signed by Arvilla Meres MD Signature Date/Time: 02/20/2023/4:41:31 PM    Final    MONITORS  LONG TERM MONITOR (3-14 DAYS) 03/02/2023  Narrative   Predominant rhythm normal sinus rhythm with average heart rate 65bpm and ranged from 49 to 105bpm.   Multiple episodes of SVT with longest run lasting 10.3 seconds and fastest HR 193bpm. Episodes corresponded to patient's complaints of symptoms   Rare PACs, atrial couplets and triplets   Rare PVCs   Patch Wear Time:  13 days and 18 hours (2024-10-11T15:29:25-0400 to 2024-10-25T09:54:26-0400)  Patient had a min HR of 49 bpm, max HR of 193 bpm, and avg HR of 65 bpm. Predominant underlying rhythm was Sinus Rhythm. 26 Supraventricular Tachycardia runs occurred, the run with the fastest interval lasting 9 beats with a max rate of 193 bpm, the longest lasting 10.3 secs with an avg rate of 136 bpm. Supraventricular Tachycardia was detected within +/- 45 seconds of symptomatic patient event(s). Isolated SVEs were rare (<1.0%), SVE Couplets were rare (<1.0%), and SVE Triplets were rare (<1.0%). Isolated VEs were rare (<1.0%), and no VE Couplets or VE Triplets were present.   CT SCANS  CT CARDIAC SCORING (SELF PAY ONLY) 05/19/2023  Addendum  05/27/2023  1:58 PM ADDENDUM REPORT: 05/27/2023 13:55  EXAM: OVER-READ INTERPRETATION CT CHEST  The following report is an over-read performed by radiologist Dr. Romona Curls of [facility] on 05/27/2023. This over-read does not include interpretation of cardiac or coronary anatomy or pathology. The coronary calcium score interpretation by the cardiologist is attached.  COMPARISON:  Chest radiograph dated 07/10/2022.  FINDINGS: Cardiovascular: Vascular calcifications are seen in the thoracic aorta. Normal heart size. No pericardial effusion.  Mediastinum/Nodes: No enlarged mediastinal lymph nodes. The visible trachea and esophagus demonstrate no significant findings.  Lungs/Pleura: There is mild bilateral subpleural reticulation/atelectasis. Emphysematous changes are noted.  Upper Abdomen: No acute abnormality.  Musculoskeletal: No chest wall mass or suspicious bone lesions identified.  IMPRESSION: 1. Mild bilateral subpleural reticulation/atelectasis.  Aortic Atherosclerosis (ICD10-I70.0) and Emphysema (ICD10-J43.9).   Electronically Signed By: Romona Curls M.D. On: 05/27/2023 13:55  Narrative CLINICAL DATA:  Cardiovascular Disease Risk stratification  EXAM: Coronary Calcium Score  TECHNIQUE: A gated, non-contrast computed tomography scan of the heart was performed using 3mm slice thickness. Axial images were analyzed on a dedicated workstation. Calcium scoring of the coronary arteries was performed using the Agatston method.  FINDINGS: Coronary arteries: Normal origins.  Coronary Calcium Score:  Left main: 180  Left anterior descending artery: 0  Left circumflex artery: 0  Right coronary artery: 289  Total: 469  Percentile: 81  Pericardium: Normal.  Ascending Aorta: Dilated ascending aorta measuring 39mm  Pulmonary artery: dilated main pulmonary artery measuring 35mm  Non-cardiac: See separate report from [facility].  IMPRESSION: Coronary calcium score of 469. This was 64 percentile for age-, race-, and sex-matched controls. Significant left main calcifications  Dilated ascending aorta measuring 39mm  Dilated main pulmonary artery measuring 35mm  RECOMMENDATIONS: Coronary artery calcium (CAC) score is a strong predictor of incident coronary heart disease (CHD) and provides predictive information beyond traditional risk factors. CAC scoring is reasonable to use in the decision to withhold, postpone, or initiate statin therapy in intermediate-risk or selected borderline-risk asymptomatic adults (age 3-75 years and LDL-C >=70 to <190 mg/dL) who do not  have diabetes or established atherosclerotic cardiovascular disease (ASCVD).* In intermediate-risk (10-year ASCVD risk >=7.5% to <20%) adults or selected borderline-risk (10-year ASCVD risk >=5% to <7.5%) adults in whom a CAC score is measured for the purpose of making a treatment decision the following recommendations have been made:  If CAC=0, it is reasonable to withhold statin therapy and reassess in 5 to 10 years, as long as higher risk conditions are absent (diabetes mellitus, family history of premature CHD in first degree relatives (males <55 years; females <65 years), cigarette smoking, or LDL >=190 mg/dL).  If CAC is 1 to 99, it is reasonable to initiate statin therapy for patients >=28 years of age.  If CAC is >=100 or >=75th percentile, it is reasonable to initiate statin therapy at any age.  Cardiology referral should be considered for patients with CAC scores >=400 or >=75th percentile.  *2018 AHA/ACC/AACVPR/AAPA/ABC/ACPM/ADA/AGS/APhA/ASPC/NLA/PCNA Guideline on the Management of Blood Cholesterol: A Report of the American College of Cardiology/American Heart Association Task Force on Clinical Practice Guidelines. J Am Coll Cardiol. 2019;73(24):3168-3209.  Epifanio Lesches, MD  Electronically Signed: By:  Epifanio Lesches M.D. On: 05/20/2023 22:22     ______________________________________________________________________________________________      Recent Labs: 03/06/2023: BUN 13; Creatinine, Ser 0.84; Potassium 4.4; Sodium 143; TSH 0.987   Lipid Panel    Component Value Date/Time   CHOL 230 (H) 01/19/2020 0926   TRIG 108.0 01/19/2020 0926   HDL 71.40 01/19/2020 0926   CHOLHDL 3 01/19/2020 0926   VLDL 21.6 01/19/2020 0926   LDLCALC 137 (H) 01/19/2020 0926     ASSESSMENT:    1. Palpitations   2. Paroxysmal SVT (supraventricular tachycardia) (HCC)   3. Nonrheumatic mitral valve regurgitation   4. Essential hypertension   5. Agatston coronary artery calcium score greater than 400       PLAN:  In order of problems listed above:  Palpitations/SVT -Heart monitor 03/18/2023 showed runs of SVT lasting as long as 10.3 seconds with a max heart rate of 193 bpm corresponding to patient's symptoms of palpitations -Atenolol was increased to 50 mg twice daily -she still notices palpitations at night but not aware of them during the day -cannot increase BB further due to HR in the low 60's -encouraged her to try to avoid sleeping on her left side at night so she is not so aware of her heart beat -Continue prescription drug management with atenolol 50 mg twice daily with as needed refills  2.  MVP/MR/AR -2D echo 02/15/2023 showed normal LV function with mild MR and mild AR  3.  HTN -BP controlled on exam today -Continue prescription drug management with atenolol 50 mg twice daily and olmesartan 40 mg daily with as needed refills -I have personally reviewed and interpreted outside labs performed by patient's PCP which showed SCr 0.84 and K+ 4.4 on 03/06/23  4.  Coronary artery calcifications -Coronary calcium score 04/2023 was elevated at 469 -She denies any anginal symptoms but has been having DOE with regular walking -Recommend starting aspirin 81 mg daily -she thinks she  had a rash once from ASA but it was a very long time ago and she really not recall the event so will try it again and if she gets a rash will stop it.  -Continue atenolol 50 mg twice daily -given her SOB I will get a Stress PET CT to rule out ischemia -Informed Consent   Shared Decision Making/Informed Consent The risks [chest pain, shortness of breath, cardiac arrhythmias, dizziness, blood  pressure fluctuations, myocardial infarction, stroke/transient ischemic attack, nausea, vomiting, allergic reaction, radiation exposure, metallic taste sensation and life-threatening complications (estimated to be 1 in 10,000)], benefits (risk stratification, diagnosing coronary artery disease, treatment guidance) and alternatives of a cardiac PET stress test were discussed in detail with Ms. Mcneary and she agrees to proceed.    5.  HLD -LDL goal < 70 -I have personally reviewed and interpreted outside labs performed by patient's PCP which showed LDL 55, HDL 41, ALT 13 on 16/01/9603 -continue Crestor 5mg  daily with PRN refills>>this was just started  Time Spent: 20 minutes total time of encounter, including 15 minutes spent in face-to-face patient care on the date of this encounter. This time includes coordination of care and counseling regarding above mentioned problem list. Remainder of non-face-to-face time involved reviewing chart documents/testing relevant to the patient encounter and documentation in the medical record. I have independently reviewed documentation from referring provider  Followup:  1 year  Medication Adjustments/Labs and Tests Ordered: Current medicines are reviewed at length with the patient today.  Concerns regarding medicines are outlined above.  Medication changes, Labs and Tests ordered today are listed in the Patient Instructions below.  There are no Patient Instructions on file for this visit.   Signed, Armanda Magic, MD  06/17/2023 11:39 AM    [facility]  [facility] [address] [place], [place], [place]  [address] Phone: [phone]; Fax: [phone]

## 2023-06-17 NOTE — Addendum Note (Signed)
Addended by: Sherle Poe R on: 06/17/2023 12:02 PM   Modules accepted: Orders

## 2023-06-22 ENCOUNTER — Encounter: Payer: Self-pay | Admitting: Cardiology

## 2023-06-22 DIAGNOSIS — I340 Nonrheumatic mitral (valve) insufficiency: Secondary | ICD-10-CM | POA: Diagnosis not present

## 2023-06-22 DIAGNOSIS — R931 Abnormal findings on diagnostic imaging of heart and coronary circulation: Secondary | ICD-10-CM | POA: Diagnosis not present

## 2023-06-22 DIAGNOSIS — I10 Essential (primary) hypertension: Secondary | ICD-10-CM | POA: Diagnosis not present

## 2023-06-22 DIAGNOSIS — R002 Palpitations: Secondary | ICD-10-CM | POA: Diagnosis not present

## 2023-06-22 DIAGNOSIS — I471 Supraventricular tachycardia, unspecified: Secondary | ICD-10-CM | POA: Diagnosis not present

## 2023-06-23 LAB — BASIC METABOLIC PANEL
BUN/Creatinine Ratio: 12 (ref 12–28)
BUN: 10 mg/dL (ref 8–27)
CO2: 23 mmol/L (ref 20–29)
Calcium: 9.9 mg/dL (ref 8.7–10.3)
Chloride: 102 mmol/L (ref 96–106)
Creatinine, Ser: 0.82 mg/dL (ref 0.57–1.00)
Glucose: 89 mg/dL (ref 70–99)
Potassium: 5.1 mmol/L (ref 3.5–5.2)
Sodium: 141 mmol/L (ref 134–144)
eGFR: 73 mL/min/{1.73_m2} (ref >59)

## 2023-06-24 LAB — GENECONNECT MOLECULAR SCREEN: Genetic Analysis Overall Interpretation: NEGATIVE

## 2023-06-25 DIAGNOSIS — H35372 Puckering of macula, left eye: Secondary | ICD-10-CM | POA: Diagnosis not present

## 2023-06-25 DIAGNOSIS — Z961 Presence of intraocular lens: Secondary | ICD-10-CM | POA: Diagnosis not present

## 2023-06-25 DIAGNOSIS — H04123 Dry eye syndrome of bilateral lacrimal glands: Secondary | ICD-10-CM | POA: Diagnosis not present

## 2023-07-10 NOTE — Addendum Note (Signed)
 Addended by: Sherle Poe R on: 07/10/2023 09:08 AM   Modules accepted: Orders

## 2023-07-14 ENCOUNTER — Ambulatory Visit (HOSPITAL_COMMUNITY): Admission: RE | Admit: 2023-07-14 | Payer: Medicare Other | Source: Ambulatory Visit

## 2023-07-20 DIAGNOSIS — S39012A Strain of muscle, fascia and tendon of lower back, initial encounter: Secondary | ICD-10-CM | POA: Diagnosis not present

## 2023-08-19 DIAGNOSIS — H43823 Vitreomacular adhesion, bilateral: Secondary | ICD-10-CM | POA: Diagnosis not present

## 2023-08-26 DIAGNOSIS — L720 Epidermal cyst: Secondary | ICD-10-CM | POA: Diagnosis not present

## 2023-08-26 DIAGNOSIS — D225 Melanocytic nevi of trunk: Secondary | ICD-10-CM | POA: Diagnosis not present

## 2023-08-26 DIAGNOSIS — D1801 Hemangioma of skin and subcutaneous tissue: Secondary | ICD-10-CM | POA: Diagnosis not present

## 2023-08-26 DIAGNOSIS — Z85828 Personal history of other malignant neoplasm of skin: Secondary | ICD-10-CM | POA: Diagnosis not present

## 2023-08-26 DIAGNOSIS — D2261 Melanocytic nevi of right upper limb, including shoulder: Secondary | ICD-10-CM | POA: Diagnosis not present

## 2023-08-26 DIAGNOSIS — D485 Neoplasm of uncertain behavior of skin: Secondary | ICD-10-CM | POA: Diagnosis not present

## 2023-08-26 DIAGNOSIS — L814 Other melanin hyperpigmentation: Secondary | ICD-10-CM | POA: Diagnosis not present

## 2023-08-26 DIAGNOSIS — C44121 Squamous cell carcinoma of skin of unspecified eyelid, including canthus: Secondary | ICD-10-CM | POA: Diagnosis not present

## 2023-08-26 DIAGNOSIS — L821 Other seborrheic keratosis: Secondary | ICD-10-CM | POA: Diagnosis not present

## 2023-08-26 DIAGNOSIS — C44329 Squamous cell carcinoma of skin of other parts of face: Secondary | ICD-10-CM | POA: Diagnosis not present

## 2023-08-26 DIAGNOSIS — L570 Actinic keratosis: Secondary | ICD-10-CM | POA: Diagnosis not present

## 2023-09-15 DIAGNOSIS — Z85828 Personal history of other malignant neoplasm of skin: Secondary | ICD-10-CM | POA: Diagnosis not present

## 2023-09-15 DIAGNOSIS — C44329 Squamous cell carcinoma of skin of other parts of face: Secondary | ICD-10-CM | POA: Diagnosis not present

## 2023-09-21 ENCOUNTER — Encounter (HOSPITAL_COMMUNITY): Payer: Self-pay

## 2023-09-21 DIAGNOSIS — M816 Localized osteoporosis [Lequesne]: Secondary | ICD-10-CM

## 2023-09-21 DIAGNOSIS — M8580 Other specified disorders of bone density and structure, unspecified site: Secondary | ICD-10-CM

## 2023-09-23 ENCOUNTER — Ambulatory Visit: Payer: Self-pay | Admitting: Cardiology

## 2023-09-23 ENCOUNTER — Encounter (HOSPITAL_COMMUNITY)
Admission: RE | Admit: 2023-09-23 | Discharge: 2023-09-23 | Disposition: A | Discharge status: Home / Self Care | Source: Ambulatory Visit | Attending: Cardiology | Admitting: Cardiology

## 2023-09-23 DIAGNOSIS — I10 Essential (primary) hypertension: Secondary | ICD-10-CM | POA: Insufficient documentation

## 2023-09-23 DIAGNOSIS — R002 Palpitations: Secondary | ICD-10-CM | POA: Diagnosis not present

## 2023-09-23 DIAGNOSIS — R931 Abnormal findings on diagnostic imaging of heart and coronary circulation: Secondary | ICD-10-CM | POA: Insufficient documentation

## 2023-09-23 DIAGNOSIS — I471 Supraventricular tachycardia, unspecified: Secondary | ICD-10-CM | POA: Insufficient documentation

## 2023-09-23 DIAGNOSIS — I340 Nonrheumatic mitral (valve) insufficiency: Secondary | ICD-10-CM | POA: Diagnosis not present

## 2023-09-23 DIAGNOSIS — R0600 Dyspnea, unspecified: Secondary | ICD-10-CM

## 2023-09-23 LAB — NM PET CT CARDIAC PERFUSION MULTI W/ABSOLUTE BLOODFLOW
MBFR: 1.75
Rest MBF: 0.97 ml/g/min
Rest Nuclear Isotope Dose: 18.2 mCi
ST Depression (mm): 0 mm
Stress MBF: 1.7 ml/g/min
Stress Nuclear Isotope Dose: 18.2 mCi
TID: 1

## 2023-09-23 MED ORDER — RUBIDIUM RB82 GENERATOR (RUBYFILL)
18.1700 | PACK | Freq: Once | INTRAVENOUS | Status: AC
  Administered 2023-09-23: 18.17 via INTRAVENOUS

## 2023-09-23 MED ORDER — REGADENOSON 0.4 MG/5ML IV SOLN
INTRAVENOUS | Status: AC
  Filled 2023-09-23: qty 5

## 2023-09-23 MED ORDER — REGADENOSON 0.4 MG/5ML IV SOLN
0.4000 mg | Freq: Once | INTRAVENOUS | Status: AC
  Administered 2023-09-23: 0.4 mg via INTRAVENOUS

## 2023-09-23 MED ORDER — RUBIDIUM RB82 GENERATOR (RUBYFILL)
18.1700 | PACK | Freq: Once | INTRAVENOUS | Status: AC
Start: 2023-09-23 — End: 2023-09-23
  Administered 2023-09-23: 18.17 via INTRAVENOUS

## 2023-09-25 NOTE — Telephone Encounter (Signed)
-----   Message from Gaylyn Keas sent at 09/23/2023  2:10 PM EDT ----- No skin stress testing.  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  Normal LV function.  Coronary calcifications were present.  Noncardiac portion

## 2023-09-25 NOTE — Telephone Encounter (Signed)
 Call to patient to advise of stress/pet/ct results. Explained that possible microvascular coronary disease with decreased flow reserve, present as well as Coronary calcifications.  Patient also verbalizes Normal LV function.  Dr. Micael Adas advising PFT w/ DLCO for SOB patient reported at visit in February 2025. Patient verbalizes understanding, order placed.

## 2023-09-29 NOTE — Telephone Encounter (Signed)
 Behind on bone scan last one 2022 with osteopenia. Referral placed for dxa. Coming soon for annual Dr. Tia Flowers

## 2023-10-08 ENCOUNTER — Encounter: Admitting: Obstetrics and Gynecology

## 2023-10-26 DIAGNOSIS — I10 Essential (primary) hypertension: Secondary | ICD-10-CM | POA: Diagnosis not present

## 2023-10-26 DIAGNOSIS — R7303 Prediabetes: Secondary | ICD-10-CM | POA: Diagnosis not present

## 2023-10-26 DIAGNOSIS — E782 Mixed hyperlipidemia: Secondary | ICD-10-CM | POA: Diagnosis not present

## 2023-10-26 DIAGNOSIS — I2510 Atherosclerotic heart disease of native coronary artery without angina pectoris: Secondary | ICD-10-CM | POA: Diagnosis not present

## 2023-10-26 DIAGNOSIS — Z1331 Encounter for screening for depression: Secondary | ICD-10-CM | POA: Diagnosis not present

## 2023-10-26 DIAGNOSIS — Z0000 Encounter for general adult medical examination without abnormal findings: Secondary | ICD-10-CM | POA: Diagnosis not present

## 2023-11-10 ENCOUNTER — Other Ambulatory Visit: Payer: Self-pay | Admitting: Family Medicine

## 2023-11-10 DIAGNOSIS — Z1231 Encounter for screening mammogram for malignant neoplasm of breast: Secondary | ICD-10-CM

## 2023-11-18 DIAGNOSIS — H3581 Retinal edema: Secondary | ICD-10-CM | POA: Diagnosis not present

## 2023-11-18 DIAGNOSIS — H43393 Other vitreous opacities, bilateral: Secondary | ICD-10-CM | POA: Diagnosis not present

## 2023-11-18 DIAGNOSIS — H43823 Vitreomacular adhesion, bilateral: Secondary | ICD-10-CM | POA: Diagnosis not present

## 2023-11-18 DIAGNOSIS — H442E1 Degenerative myopia with other maculopathy, right eye: Secondary | ICD-10-CM | POA: Diagnosis not present

## 2023-11-18 DIAGNOSIS — H4422 Degenerative myopia, left eye: Secondary | ICD-10-CM | POA: Diagnosis not present

## 2023-11-18 DIAGNOSIS — H43811 Vitreous degeneration, right eye: Secondary | ICD-10-CM | POA: Diagnosis not present

## 2023-11-30 ENCOUNTER — Ambulatory Visit
Admission: RE | Admit: 2023-11-30 | Discharge: 2023-11-30 | Disposition: A | Discharge status: Home / Self Care | Source: Ambulatory Visit | Attending: Family Medicine | Admitting: Family Medicine

## 2023-11-30 DIAGNOSIS — Z1231 Encounter for screening mammogram for malignant neoplasm of breast: Secondary | ICD-10-CM

## 2023-12-11 ENCOUNTER — Encounter: Payer: Self-pay | Admitting: Cardiology

## 2023-12-11 DIAGNOSIS — R002 Palpitations: Secondary | ICD-10-CM

## 2023-12-11 DIAGNOSIS — E785 Hyperlipidemia, unspecified: Secondary | ICD-10-CM

## 2023-12-16 ENCOUNTER — Ambulatory Visit: Attending: Cardiology

## 2023-12-16 DIAGNOSIS — R002 Palpitations: Secondary | ICD-10-CM

## 2023-12-16 MED ORDER — ATENOLOL 25 MG PO TABS: 25.0000 mg | ORAL_TABLET | Freq: Two times a day (BID) | ORAL | 3 refills | Status: DC

## 2023-12-16 MED ORDER — ATENOLOL 25 MG PO TABS: 25.0000 mg | ORAL_TABLET | Freq: Two times a day (BID) | ORAL | Status: DC

## 2023-12-16 NOTE — Progress Notes (Unsigned)
 Enrolled for [employer] to mail a ZIO XT long term holter monitor to the patients address on file.

## 2024-01-04 DIAGNOSIS — R002 Palpitations: Secondary | ICD-10-CM | POA: Diagnosis not present

## 2024-01-05 ENCOUNTER — Ambulatory Visit: Payer: Self-pay | Admitting: Cardiology

## 2024-01-05 DIAGNOSIS — R002 Palpitations: Secondary | ICD-10-CM | POA: Diagnosis not present

## 2024-01-06 MED ORDER — METOPROLOL SUCCINATE ER 50 MG PO TB24: 50.0000 mg | ORAL_TABLET | Freq: Every day | ORAL | 3 refills | Status: AC

## 2024-01-06 NOTE — Telephone Encounter (Signed)
 Call to patient to advise that heart monitor showed 11 episodes of SVT lasting as long as 11 beats. Patient agrees to stop Atenolol  and start Toprol  XL 50mg  daily, will check BP/HR for one week and send in results. Patient verbalizes that if she continues to have palpitations on toprol  to let our office know.

## 2024-01-06 NOTE — Telephone Encounter (Signed)
-----   Message from Wilbert Bihari sent at 01/05/2024  3:31 PM EDT ----- Heart monitor showed 11 episodes of SVT lasting as long as 11 beats.  Still having SVT on Atenolol  that is symptomatic.  Please stop Atenolol  and start Toprol  XL 50mg  daily.  Check BP and HR twice  daily for a week and call with results.  If she continues to have palpitations on TOprol  please have her call ----- Message ----- From: Bihari Wilbert SAUNDERS, MD Sent: 01/05/2024   3:30 PM EDT To: Wilbert SAUNDERS Bihari, MD

## 2024-01-08 ENCOUNTER — Encounter: Payer: Self-pay | Admitting: Cardiology

## 2024-01-14 ENCOUNTER — Telehealth: Payer: Self-pay

## 2024-01-14 NOTE — Telephone Encounter (Signed)
 Call to patient to discuss stopping toprol  and verifying what dose of atenolol  she is currently taking. No answer, left voice mail asking patient to call our office to discuss.

## 2024-01-18 ENCOUNTER — Telehealth: Payer: Self-pay

## 2024-01-18 NOTE — Telephone Encounter (Signed)
 Call to patient to advise Dr. Shlomo is ok with her going back to Atenolol  25mg  BID. Reviewed that Dr. Shlomo had recommended changing to Toprol  or increasing dose of Atenolol  was because she was complaining of palpitations still on that dose and was having SVT on monitor. Explained that Dr. Shlomo suspected that the Toprol  was not high enough dose and that is why her HR was higher on the Toprol . Advised that If she wants to stay on Atenolol  25mg  BID that is fine but it may not be adequate to suppress her palpitations. Patient states she wants to research her options and get back to us .

## 2024-02-05 DIAGNOSIS — Z23 Encounter for immunization: Secondary | ICD-10-CM | POA: Diagnosis not present

## 2024-02-10 NOTE — Progress Notes (Deleted)
 Cardiology Clinic Note   Patient Name: Michelle Davidson Date of Encounter: 02/10/2024  Primary Care Provider:  Rolinda Millman, MD Primary Cardiologist:  None  Patient Profile    Michelle Davidson 78 year old female presents the clinic today for to discuss medication management.  Past Medical History    Past Medical History:  Diagnosis Date   Agatston coronary artery calcium score greater than 400    Cor CAL score 469 on 05/18/2023   Allergy    seasonal   Aortic insufficiency    Mild by echo 01/2023   Arthritis    Basal cell carcinoma    chest   Cataract    removed from both eyes   Choroidal neovascularization    Right eye   CNVM (choroidal neovascular membrane), right    Fibromyalgia    History of chicken pox    History of colon polyps    Hyperlipidemia    Hypertension    IBS (irritable bowel syndrome)    Migraines    Mitral valve prolapse    Mild MR by echo 02/15/2023   PAC (premature atrial contraction)    PVC (premature ventricular contraction)    Past Surgical History:  Procedure Laterality Date   BELPHAROPTOSIS REPAIR Right 2010   BREAST BIOPSY Left 2011   Titanium chip implanted to mark the spot/benign findings   COLONOSCOPY     EXCISION METACARPAL MASS Right 01/11/2021   Procedure: RIGHT LONG FINGER EXCISION CYST AND DEBRIDEMENT DISTAL INTERPHALANGEAL JOINT;  Surgeon: Murrell Drivers, MD;  Location: [facility];  Service: Orthopedics;  Laterality: Right;  30 MIN   EYE SURGERY Bilateral 03/28/2018   cataract removal with lens implants. Dr.Beavis   TUBAL LIGATION  1978   WISDOM TOOTH EXTRACTION      Allergies  Allergies  Allergen Reactions   Esomeprazole Magnesium     Syncope   Lipitor [Atorvastatin Calcium]     Muscle aches   Zocor [Simvastatin]     Muscle aches   Lansoprazole     Other reaction(s): Not available   Nexium [Esomeprazole Magnesium]    Aspirin Rash   Avelox [Moxifloxacin Hcl In Nacl] Rash    History of Present  Illness    Michelle Davidson has a PMH of fibromyalgia, hyperlipidemia, hypertension, mitral valve prolapse, MR, PACs, PVCs and palpitations.  She wore a cardiac event monitor 11/24 which showed multiple episodes of SVT with the longest episode lasting 10.3 seconds and maximum heart rate of 193.  Her episodes of SVT corresponded to her palpitations.  Her atenolol  was increased to 50 mg twice daily.  Her echocardiogram 02/20/2023 showed LVEF of 55-60%, G1 DD, mild MR and AR.  She underwent coronary calcium scoring which showed a calcium score of 469 placing her in the 81st percentile for age and race and sex matched controls.  This was on 05/19/2023.  She was seen in follow-up by Dr. Shlomo on 06/17/2023.  During that time she was doing well.  She was frustrated with having palpitations.  She noted these when laying down to go to sleep.  She reported that she could feel her heart beating and then felt like it would stop.  She was concerned by this.  She felt it was very annoying.  She denied palpitations during the day.  She did notice some DOE when she would try to walk with her husband fast.  She lives at [address] and walks to [employer].  This makes her  out of breath.  She does have a history of tobacco use in the past.  She quit smoking 30 years ago.  She denied chest pain, pressure, orthopnea, PND, dizziness and syncope.  She did compliance with her medications.  01/18/2024.  She reported a heart rate in the upper 40s with increased dose of atenolol  to 50 mg 2 times per day.  She reduced her atenolol  back to 25 mg 2 times daily.  She presents to the clinic today for follow-up evaluation.  She states***.  *** denies chest pain, shortness of breath, lower extremity edema, fatigue, palpitations, melena, hematuria, hemoptysis, diaphoresis, weakness, presyncope, syncope, orthopnea, and PND.  Palpitations, paroxysmal SVT-reports that she still notices palpitations in the evening when she lays down.   Was concerned that her heart rate was in the 40s with increased atenolol  dosing.  She reduced her atenolol  to 25 mg daily due to this.  Cardiac event monitor previously showed episodes of SVT that corresponded to patient's complaints.  Details above. Increase atenolol  to 37.5 mg twice daily Avoid triggers caffeine, chocolate, EtOH, dehydration excetra. Increase physical activity as tolerated  Essential hypertension-BP today***. Maintain blood pressure log Continue current medical therapy Heart healthy low-sodium diet  Coronary artery disease-denies exertional chest pain.  No episodes of angina reported.  Cardiac PET/CT 09/23/2023 showed normal perfusion and was intermediate due to decreased flow reserve.  PFT was recommended Refer to pulmonology for consideration of PFTs Continue rosuvastatin Heart healthy low-sodium high-fiber diet  Mitral valve regurgitation-echocardiogram 10/24 showed normal LVEF and mild MR.  No increased DOE or activity intolerance. Maintain physical activity/increase as tolerated Repeat echocardiogram when clinically indicated  Disposition: Follow-up with Dr. Shlomo or me in 3-4 months.  Home Medications    Prior to Admission medications   Medication Sig Start Date End Date Taking? Authorizing Provider  amitriptyline  (ELAVIL ) 25 MG tablet TAKE 1 TABLET BY MOUTH EVERYDAY AT BEDTIME 12/10/20   Webb, Padonda B, FNP  fluticasone  (FLONASE ) 50 MCG/ACT nasal spray SPRAY 1 SPRAY INTO BOTH NOSTRILS DAILY. 04/08/21   Berneta Elsie Sayre, MD  metoprolol  succinate (TOPROL  XL) 50 MG 24 hr tablet Take 1 tablet (50 mg total) by mouth daily. Take with or immediately following a meal. 01/06/24   Turner, Wilbert SAUNDERS, MD  Multiple Vitamins-Minerals (CENTRUM SILVER 50+WOMEN PO) Take 1 tablet by mouth daily.    [provider]  olmesartan (BENICAR) 40 MG tablet Take 40 mg by mouth daily. 05/03/21   [provider]  Polyethyl Glycol-Propyl Glycol 0.4-0.3 % SOLN Place into  both eyes in the morning and at bedtime.    [provider]  rosuvastatin (CRESTOR) 5 MG tablet 1 tablet Orally Once a day for 90 days 06/03/23   [provider]    Family History    Family History  Problem Relation Age of Onset   Arthritis Mother    Hyperlipidemia Mother    Hypertension Mother    Colon cancer Father    Colon cancer Sister        mets to liver Dec 26, 2018   Breast cancer Daughter 34   Diabetes Paternal Uncle    Kidney cancer Paternal Uncle    Arthritis Maternal Grandmother    Heart attack Maternal Grandfather    Lung cancer Maternal Grandfather        Smoker   Diabetes Paternal Grandmother    Uterine cancer Paternal Grandmother    Skin cancer Paternal Grandfather    Breast cancer Cousin  Stomach cancer Neg Hx    Esophageal cancer Neg Hx    Rectal cancer Neg Hx    She indicated that her mother is deceased. She indicated that her father is deceased. She indicated that her sister is deceased. She indicated that her maternal grandmother is deceased. She indicated that her maternal grandfather is deceased. She indicated that her paternal grandmother is deceased. She indicated that her paternal grandfather is deceased. She indicated that both of her daughters are alive. She indicated that the status of her paternal uncle is unknown. She indicated that the status of her cousin is unknown. She indicated that the status of her neg hx is unknown.  Social History    Social History   Socioeconomic History   Marital status: Married    Spouse name: Not on file   Number of children: 2   Years of education: Not on file   Highest education level: Not on file  Occupational History   Occupation: retired  Tobacco Use   Smoking status: Former    Current packs/day: 0.00    Types: Cigarettes    Quit date: 1997    Years since quitting: 28.8   Smokeless tobacco: Never  Vaping Use   Vaping status: Never Used  Substance and Sexual Activity   Alcohol use:  Yes    Comment: cocktail every night   Drug use: No   Sexual activity: Not Currently    Partners: Male    Comment: 1st intercourse- 17, partners- 4, widow  Other Topics Concern   Not on file  Social History Narrative   Not on file   Social Drivers of Health   Financial Resource Strain: Low Risk  (01/26/2020)   Overall Financial Resource Strain (CARDIA)    Difficulty of Paying Living Expenses: Not hard at all  Food Insecurity: No Food Insecurity (01/26/2020)   Hunger Vital Sign    Worried About Running Out of Food in the Last Year: Never true    Ran Out of Food in the Last Year: Never true  Transportation Needs: No Transportation Needs (01/26/2020)   PRAPARE - Administrator, Civil Service (Medical): No    Lack of Transportation (Non-Medical): No  Physical Activity: Inactive (01/26/2020)   Exercise Vital Sign    Days of Exercise per Week: 0 days    Minutes of Exercise per Session: 0 min  Stress: No Stress Concern Present (01/26/2020)   [employer] of Occupational Health - Occupational Stress Questionnaire    Feeling of Stress : Not at all  Social Connections: Moderately Isolated (01/26/2020)   Social Connection and Isolation Panel    Frequency of Communication with Friends and Family: More than three times a week    Frequency of Social Gatherings with Friends and Family: Once a week    Attends Religious Services: Never    Database administrator or Organizations: Yes    Attends Engineer, structural: More than 4 times per year    Marital Status: Widowed  Intimate Partner Violence: Not At Risk (01/26/2020)   Humiliation, Afraid, Rape, and Kick questionnaire    Fear of Current or Ex-Partner: No    Emotionally Abused: No    Physically Abused: No    Sexually Abused: No     Review of Systems    General:  No chills, fever, night sweats or weight changes.  Cardiovascular:  No chest pain, dyspnea on exertion, edema, orthopnea, palpitations, paroxysmal  nocturnal dyspnea. Dermatological: No rash, lesions/masses  Respiratory: No cough, dyspnea Urologic: No hematuria, dysuria Abdominal:   No nausea, vomiting, diarrhea, bright red blood per rectum, melena, or hematemesis Neurologic:  No visual changes, wkns, changes in mental status. All other systems reviewed and are otherwise negative except as noted above.  Physical Exam    VS:  There were no vitals taken for this visit. , BMI There is no height or weight on file to calculate BMI. GEN: Well nourished, well developed, in no acute distress. HEENT: normal. Neck: Supple, no JVD, carotid bruits, or masses. Cardiac: RRR, no murmurs, rubs, or gallops. No clubbing, cyanosis, edema.  Radials/DP/PT 2+ and equal bilaterally.  Respiratory:  Respirations regular and unlabored, clear to auscultation bilaterally. GI: Soft, nontender, nondistended, BS + x 4. MS: no deformity or atrophy. Skin: warm and dry, no rash. Neuro:  Strength and sensation are intact. Psych: Normal affect.  Accessory Clinical Findings    Recent Labs: 03/06/2023: TSH 0.987 06/22/2023: BUN 10; Creatinine, Ser 0.82; Potassium 5.1; Sodium 141   Recent Lipid Panel    Component Value Date/Time   CHOL 230 (H) 01/19/2020 0926   TRIG 108.0 01/19/2020 0926   HDL 71.40 01/19/2020 0926   CHOLHDL 3 01/19/2020 0926   VLDL 21.6 01/19/2020 0926   LDLCALC 137 (H) 01/19/2020 0926    No BP recorded.  {Refresh Note OR Click here to enter BP  :1}***    ECG personally reviewed by me today- ***    Echocardiogram 02/20/2023 IMPRESSIONS     1. Left ventricular ejection fraction, by estimation, is 55 to 60%. The  left ventricle has normal function. The left ventricle has no regional  wall motion abnormalities. Left ventricular diastolic parameters are  consistent with Grade I diastolic  dysfunction (impaired relaxation).   2. Right ventricular systolic function is normal. The right ventricular  size is normal.   3. Left atrial size  was moderately dilated.   4. The mitral valve is normal in structure. Mild mitral valve  regurgitation. No evidence of mitral stenosis.   5. The aortic valve is tricuspid. There is mild calcification of the  aortic valve. Aortic valve regurgitation is mild. Aortic valve  sclerosis/calcification is present, without any evidence of aortic  stenosis.   6. Aortic dilatation noted. There is borderline dilatation of the aortic  root, measuring 38 mm. There is borderline dilatation of the ascending  aorta, measuring 38 mm.   7. The inferior vena cava is normal in size with greater than 50%  respiratory variability, suggesting right atrial pressure of 3 mmHg.   FINDINGS   Left Ventricle: Left ventricular ejection fraction, by estimation, is 55  to 60%. The left ventricle has normal function. The left ventricle has no  regional wall motion abnormalities. The left ventricular internal cavity  size was normal in size. There is   no left ventricular hypertrophy. Left ventricular diastolic parameters  are consistent with Grade I diastolic dysfunction (impaired relaxation).   Right Ventricle: The right ventricular size is normal. No increase in  right ventricular wall thickness. Right ventricular systolic function is  normal.   Left Atrium: Left atrial size was moderately dilated.   Right Atrium: Right atrial size was normal in size.   Pericardium: There is no evidence of pericardial effusion.   Mitral Valve: The mitral valve is normal in structure. Mild mitral annular  calcification. Mild mitral valve regurgitation. No evidence of mitral  valve stenosis.   Tricuspid Valve: The tricuspid valve is normal in structure. Tricuspid  valve regurgitation is mild . No evidence of tricuspid stenosis.   Aortic Valve: The aortic valve is tricuspid. There is mild calcification  of the aortic valve. Aortic valve regurgitation is mild. Aortic valve  sclerosis/calcification is present, without any  evidence of aortic  stenosis.   Pulmonic Valve: The pulmonic valve was normal in structure. Pulmonic valve  regurgitation is mild. No evidence of pulmonic stenosis.   Aorta: Aortic dilatation noted. There is borderline dilatation of the  aortic root, measuring 38 mm. There is borderline dilatation of the  ascending aorta, measuring 38 mm.   Venous: The inferior vena cava is normal in size with greater than 50%  respiratory variability, suggesting right atrial pressure of 3 mmHg.   IAS/Shunts: No atrial level shunt detected by color flow Doppler.   Cardiac PET stress test 09/23/2023  The study is normal for epicardial perfusion. The study is intermediate risk due to decrease of flow reserve.  This finding can be seen in microvascular coronary disease; finding is less specific in patient with significant pulmonary hypertension- correlation to pulmonary artery dilation may be considered.   LV perfusion is normal. There is no evidence of infarction.   Rest left ventricular function is normal. Stress left ventricular function is normal. End diastolic cavity size is normal. End systolic cavity size is normal.   Myocardial blood flow was computed to be 0.88ml/g/min at rest and 1.42ml/g/min at stress. Global myocardial blood flow reserve was 1.75 and was mildly abnormal.   Coronary calcium was present on the attenuation correction CT images. Severe coronary calcifications were present. Coronary calcifications were present in the left anterior descending artery and right coronary artery distribution(s).  Aortic athersclerosis.  Aortic valve calcification.  Non-contrast aortic dimensions are at the upper limits or normal  Pulmonary artery is dilated on non-contrast study.   Electronically Signed  By: Stanly Leavens M.D.   EXAM: OVER-READ INTERPRETATION  PET-CT CHEST   The following report is an over-read performed by radiologist Dr. Camellia Lang [facility] on 09/23/2023. This  over-read does not include interpretation of cardiac or coronary anatomy or pathology. The cardiac PET and cardiac CT interpretation by the cardiologist is to be attached.   COMPARISON:  None.   FINDINGS: No evidence for lymphadenopathy within the visualized mediastinum or hilar regions.   Centrilobular and paraseptal emphysema evident.   Visualized portions of the upper abdomen are unremarkable.   No suspicious lytic or sclerotic osseous abnormality.   IMPRESSION: No acute or clinically significant extracardiac findings.     Electronically Signed   By: Camellia Candle M.D.   On: 09/23/2023 11:41  Cardiac event monitor 01/05/24    Predominant rhythm was normal sinus rhythm with average heart rate 65bpm and ranged from 48 to 171bpm   11 episodes of SVT c/w paroxysmal atrial fibrillation lasting a long as 11 beats with max HR 171bpm. Patient's sx corresponded to episode of SVT.   Rare PACs, atrial couplets and triplets.   Rare PVCs     Patch Wear Time:  11 days and 2 hours (2025-08-24T17:45:41-0400 to 2025-09-04T19:49:50-0400)   Patient had a min HR of 48 bpm, max HR of 171 bpm, and avg HR of 65 bpm. Predominant underlying rhythm was Sinus Rhythm. 11 Supraventricular Tachycardia runs occurred, the run with the fastest interval lasting 7 beats with a max rate of 171 bpm, the  longest lasting 11 beats with an avg rate of 97 bpm. Supraventricular Tachycardia was detected within +/- 45 seconds of  symptomatic patient event(s). Isolated SVEs were rare (<1.0%), SVE Couplets were rare (<1.0%), and SVE Triplets were rare (<1.0%).  Isolated VEs were rare (<1.0%), and no VE Couplets or VE Triplets were present.     Assessment & Plan   1.  ***   Josefa HERO. Dailyn Reith NP-C     02/10/2024, 9:33 AM [facility] [address] [place], [place] [address] Office [phone]    Notice: This dictation was prepared with Dragon dictation along with smaller  phrase technology. Any transcriptional errors that result from this process are unintentional and may not be corrected upon review.   I spent***minutes examining this patient, reviewing medications, and using patient centered shared decision making involving their cardiac care.   I spent  20 minutes reviewing past medical history,  medications, and prior cardiac tests.

## 2024-02-12 ENCOUNTER — Ambulatory Visit: Admitting: General Practice

## 2024-02-12 ENCOUNTER — Telehealth: Payer: Self-pay

## 2024-02-12 DIAGNOSIS — R002 Palpitations: Secondary | ICD-10-CM

## 2024-02-12 NOTE — Telephone Encounter (Signed)
 Please see Telephone encounter from 02/12/24

## 2024-02-12 NOTE — Telephone Encounter (Signed)
 Call to patient to relay that Dr. Shlomo has reviewed chart and says:  Patient testing at this time would be to refer her to EP for further evaluation. Atenolol  25 and 50 mg twice daily does not suppress her palpitations but did not like the feeling of Toprol  and intermittently was having bradycardia on atenolol  50 mg twice daily. At this time would stay on atenolol  25 mg twice daily.  Patient agrees to stay on atenolol  25 mg BID, agrees to EP referral.  Order placed.

## 2024-02-17 DIAGNOSIS — H43823 Vitreomacular adhesion, bilateral: Secondary | ICD-10-CM | POA: Diagnosis not present

## 2024-05-12 NOTE — Telephone Encounter (Signed)
 Spoke w/ patient to see if she was ready to schedule EP referral from Dr. Shlomo as she had requested to delay it when EP Schedulers called to schedule on 10/31 and 11/26. She states that when she got the call from EP Schedulers to schedule this referral on 10/31 and 11/26 - she was surprised as she didn't know what this was for. She would like a call back with more information.

## 2024-05-16 NOTE — Telephone Encounter (Signed)
 Call to patient and reviewed that because she was unable to find a beta blocker that fully suppressed her symptoms Dr. Shlomo referred to EP. Patient now agrees to EP consult.

## 2024-05-31 ENCOUNTER — Telehealth: Payer: Self-pay

## 2024-05-31 NOTE — Telephone Encounter (Signed)
 Pt in the office today and requests to see Dr. Court moving forward.

## 2024-06-24 ENCOUNTER — Ambulatory Visit: Admitting: Cardiology
